# Patient Record
Sex: Female | Born: 1960 | Race: White | Hispanic: No | Marital: Married | Smoking: Current every day smoker
Health system: Southern US, Community
[De-identification: ages and names within clinical notes are randomized; demographics above are authoritative.]

## PROBLEM LIST (undated history)

## (undated) DIAGNOSIS — N3010 Interstitial cystitis (chronic) without hematuria: Secondary | ICD-10-CM

## (undated) DIAGNOSIS — R0602 Shortness of breath: Secondary | ICD-10-CM

## (undated) DIAGNOSIS — F419 Anxiety disorder, unspecified: Secondary | ICD-10-CM

## (undated) DIAGNOSIS — I639 Cerebral infarction, unspecified: Secondary | ICD-10-CM

## (undated) DIAGNOSIS — F329 Major depressive disorder, single episode, unspecified: Secondary | ICD-10-CM

## (undated) DIAGNOSIS — F32A Depression, unspecified: Secondary | ICD-10-CM

## (undated) DIAGNOSIS — F17200 Nicotine dependence, unspecified, uncomplicated: Secondary | ICD-10-CM

## (undated) HISTORY — DX: Nicotine dependence, unspecified, uncomplicated: F17.200

## (undated) HISTORY — DX: Interstitial cystitis (chronic) without hematuria: N30.10

---

## 1997-12-24 ENCOUNTER — Encounter: Payer: Self-pay | Admitting: Emergency Medicine

## 1997-12-24 ENCOUNTER — Emergency Department (HOSPITAL_COMMUNITY): Admission: EM | Admit: 1997-12-24 | Discharge: 1997-12-24 | Payer: Self-pay | Admitting: Emergency Medicine

## 1999-03-17 ENCOUNTER — Inpatient Hospital Stay (HOSPITAL_COMMUNITY): Admission: AD | Admit: 1999-03-17 | Discharge: 1999-03-17 | Payer: Self-pay | Admitting: Obstetrics

## 1999-08-25 ENCOUNTER — Inpatient Hospital Stay (HOSPITAL_COMMUNITY): Admission: AD | Admit: 1999-08-25 | Discharge: 1999-08-25 | Payer: Self-pay | Admitting: *Deleted

## 1999-08-25 ENCOUNTER — Encounter: Payer: Self-pay | Admitting: *Deleted

## 1999-10-28 ENCOUNTER — Emergency Department (HOSPITAL_COMMUNITY): Admission: EM | Admit: 1999-10-28 | Discharge: 1999-10-28 | Payer: Self-pay | Admitting: *Deleted

## 1999-11-25 ENCOUNTER — Emergency Department (HOSPITAL_COMMUNITY): Admission: EM | Admit: 1999-11-25 | Discharge: 1999-11-26 | Payer: Self-pay | Admitting: *Deleted

## 2000-04-21 ENCOUNTER — Emergency Department (HOSPITAL_COMMUNITY): Admission: EM | Admit: 2000-04-21 | Discharge: 2000-04-21 | Payer: Self-pay | Admitting: Emergency Medicine

## 2000-05-01 ENCOUNTER — Emergency Department (HOSPITAL_COMMUNITY): Admission: EM | Admit: 2000-05-01 | Discharge: 2000-05-01 | Payer: Self-pay | Admitting: Internal Medicine

## 2000-05-23 ENCOUNTER — Emergency Department (HOSPITAL_COMMUNITY): Admission: EM | Admit: 2000-05-23 | Discharge: 2000-05-23 | Payer: Self-pay | Admitting: Emergency Medicine

## 2000-06-26 ENCOUNTER — Encounter: Payer: Self-pay | Admitting: Emergency Medicine

## 2000-06-26 ENCOUNTER — Emergency Department (HOSPITAL_COMMUNITY): Admission: EM | Admit: 2000-06-26 | Discharge: 2000-06-26 | Payer: Self-pay | Admitting: Emergency Medicine

## 2000-06-30 ENCOUNTER — Emergency Department (HOSPITAL_COMMUNITY): Admission: EM | Admit: 2000-06-30 | Discharge: 2000-06-30 | Payer: Self-pay | Admitting: Emergency Medicine

## 2000-11-26 ENCOUNTER — Encounter: Payer: Self-pay | Admitting: Emergency Medicine

## 2000-11-26 ENCOUNTER — Emergency Department (HOSPITAL_COMMUNITY): Admission: EM | Admit: 2000-11-26 | Discharge: 2000-11-26 | Payer: Self-pay

## 2000-12-31 ENCOUNTER — Emergency Department (HOSPITAL_COMMUNITY): Admission: EM | Admit: 2000-12-31 | Discharge: 2000-12-31 | Payer: Self-pay

## 2001-10-17 ENCOUNTER — Emergency Department (HOSPITAL_COMMUNITY): Admission: EM | Admit: 2001-10-17 | Discharge: 2001-10-17 | Payer: Self-pay | Admitting: Emergency Medicine

## 2001-10-17 ENCOUNTER — Encounter: Payer: Self-pay | Admitting: Emergency Medicine

## 2001-10-18 ENCOUNTER — Emergency Department (HOSPITAL_COMMUNITY): Admission: EM | Admit: 2001-10-18 | Discharge: 2001-10-18 | Payer: Self-pay | Admitting: Emergency Medicine

## 2001-10-21 ENCOUNTER — Emergency Department (HOSPITAL_COMMUNITY): Admission: EM | Admit: 2001-10-21 | Discharge: 2001-10-21 | Payer: Self-pay | Admitting: Emergency Medicine

## 2002-12-06 ENCOUNTER — Emergency Department (HOSPITAL_COMMUNITY): Admission: EM | Admit: 2002-12-06 | Discharge: 2002-12-06 | Payer: Self-pay | Admitting: Emergency Medicine

## 2003-08-24 ENCOUNTER — Emergency Department (HOSPITAL_COMMUNITY): Admission: EM | Admit: 2003-08-24 | Discharge: 2003-08-24 | Payer: Self-pay | Admitting: Emergency Medicine

## 2003-09-09 ENCOUNTER — Encounter: Admission: RE | Admit: 2003-09-09 | Discharge: 2003-09-09 | Payer: Self-pay | Admitting: Obstetrics and Gynecology

## 2003-09-15 ENCOUNTER — Ambulatory Visit (HOSPITAL_COMMUNITY): Admission: RE | Admit: 2003-09-15 | Discharge: 2003-09-15 | Payer: Self-pay | Admitting: Obstetrics and Gynecology

## 2005-01-03 ENCOUNTER — Encounter: Admission: RE | Admit: 2005-01-03 | Discharge: 2005-01-03 | Payer: Self-pay | Admitting: Neurology

## 2005-01-04 ENCOUNTER — Encounter: Admission: RE | Admit: 2005-01-04 | Discharge: 2005-01-04 | Payer: Self-pay | Admitting: Neurology

## 2005-08-22 ENCOUNTER — Encounter: Admission: RE | Admit: 2005-08-22 | Discharge: 2005-08-22 | Payer: Self-pay | Admitting: Family Medicine

## 2006-03-07 HISTORY — PX: CHOLECYSTECTOMY: SHX55

## 2006-03-07 HISTORY — PX: OTHER SURGICAL HISTORY: SHX169

## 2006-11-21 ENCOUNTER — Ambulatory Visit (HOSPITAL_BASED_OUTPATIENT_CLINIC_OR_DEPARTMENT_OTHER): Admission: RE | Admit: 2006-11-21 | Discharge: 2006-11-21 | Payer: Self-pay | Admitting: Urology

## 2007-08-28 ENCOUNTER — Other Ambulatory Visit: Admission: RE | Admit: 2007-08-28 | Discharge: 2007-08-28 | Payer: Self-pay | Admitting: Gynecology

## 2007-10-24 ENCOUNTER — Emergency Department (HOSPITAL_COMMUNITY): Admission: EM | Admit: 2007-10-24 | Discharge: 2007-10-24 | Payer: Self-pay | Admitting: Emergency Medicine

## 2007-11-06 ENCOUNTER — Ambulatory Visit: Payer: Self-pay | Admitting: Gynecology

## 2007-11-13 ENCOUNTER — Ambulatory Visit (HOSPITAL_COMMUNITY): Admission: RE | Admit: 2007-11-13 | Discharge: 2007-11-13 | Payer: Self-pay | Admitting: Gastroenterology

## 2007-12-03 ENCOUNTER — Encounter (INDEPENDENT_AMBULATORY_CARE_PROVIDER_SITE_OTHER): Payer: Self-pay | Admitting: General Surgery

## 2007-12-03 ENCOUNTER — Ambulatory Visit (HOSPITAL_COMMUNITY): Admission: RE | Admit: 2007-12-03 | Discharge: 2007-12-04 | Payer: Self-pay | Admitting: General Surgery

## 2008-02-05 HISTORY — PX: OTHER SURGICAL HISTORY: SHX169

## 2008-02-27 ENCOUNTER — Ambulatory Visit: Payer: Self-pay | Admitting: Gynecology

## 2008-03-03 ENCOUNTER — Emergency Department (HOSPITAL_COMMUNITY): Admission: EM | Admit: 2008-03-03 | Discharge: 2008-03-03 | Payer: Self-pay | Admitting: Emergency Medicine

## 2008-03-05 ENCOUNTER — Encounter: Payer: Self-pay | Admitting: Gynecology

## 2008-03-05 ENCOUNTER — Ambulatory Visit: Payer: Self-pay | Admitting: Gynecology

## 2008-03-05 ENCOUNTER — Ambulatory Visit (HOSPITAL_BASED_OUTPATIENT_CLINIC_OR_DEPARTMENT_OTHER): Admission: RE | Admit: 2008-03-05 | Discharge: 2008-03-05 | Payer: Self-pay | Admitting: Gynecology

## 2008-03-06 ENCOUNTER — Ambulatory Visit (HOSPITAL_COMMUNITY): Admission: RE | Admit: 2008-03-06 | Discharge: 2008-03-06 | Payer: Self-pay | Admitting: Gynecology

## 2008-03-19 ENCOUNTER — Ambulatory Visit: Payer: Self-pay | Admitting: Gynecology

## 2008-04-01 ENCOUNTER — Ambulatory Visit: Payer: Self-pay | Admitting: Gynecology

## 2008-07-17 ENCOUNTER — Ambulatory Visit: Payer: Self-pay | Admitting: Gynecology

## 2008-09-01 ENCOUNTER — Emergency Department (HOSPITAL_COMMUNITY): Admission: EM | Admit: 2008-09-01 | Discharge: 2008-09-01 | Payer: Self-pay | Admitting: Family Medicine

## 2008-12-06 ENCOUNTER — Emergency Department (HOSPITAL_COMMUNITY): Admission: EM | Admit: 2008-12-06 | Discharge: 2008-12-06 | Payer: Self-pay | Admitting: Emergency Medicine

## 2008-12-11 ENCOUNTER — Encounter (INDEPENDENT_AMBULATORY_CARE_PROVIDER_SITE_OTHER): Payer: Self-pay | Admitting: Family Medicine

## 2008-12-11 ENCOUNTER — Ambulatory Visit: Payer: Self-pay | Admitting: Surgery

## 2008-12-11 ENCOUNTER — Ambulatory Visit: Admission: RE | Admit: 2008-12-11 | Discharge: 2008-12-11 | Payer: Self-pay | Admitting: Family Medicine

## 2008-12-31 IMAGING — CR DG CHEST 2V
2 series · 2 of 2 positions shown · non-contrast
Comparison: 12/31/2000 study report

CLINICAL DATA: History given of cholelithiasis.  Preoperative
cardiopulmonary evaluation.

CHEST - 2 VIEW

[view not recorded (1 of 2)]
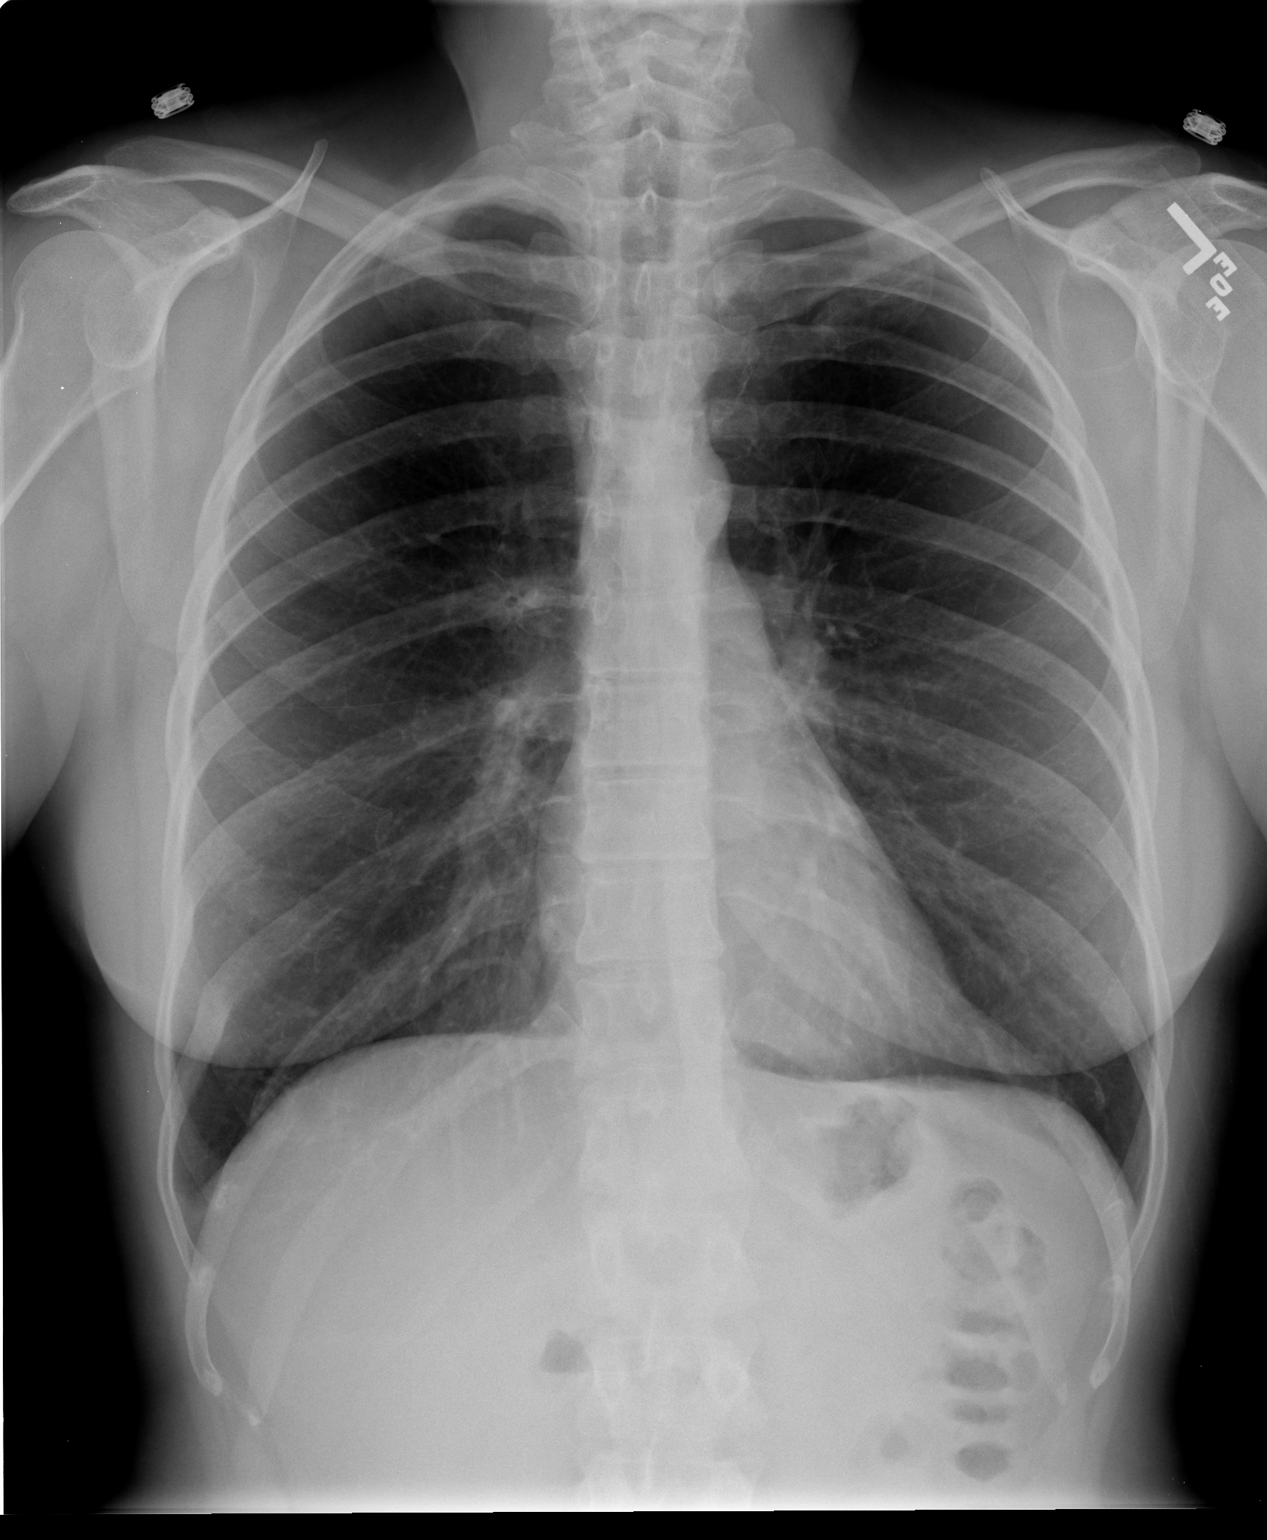

[view not recorded (2 of 2)]
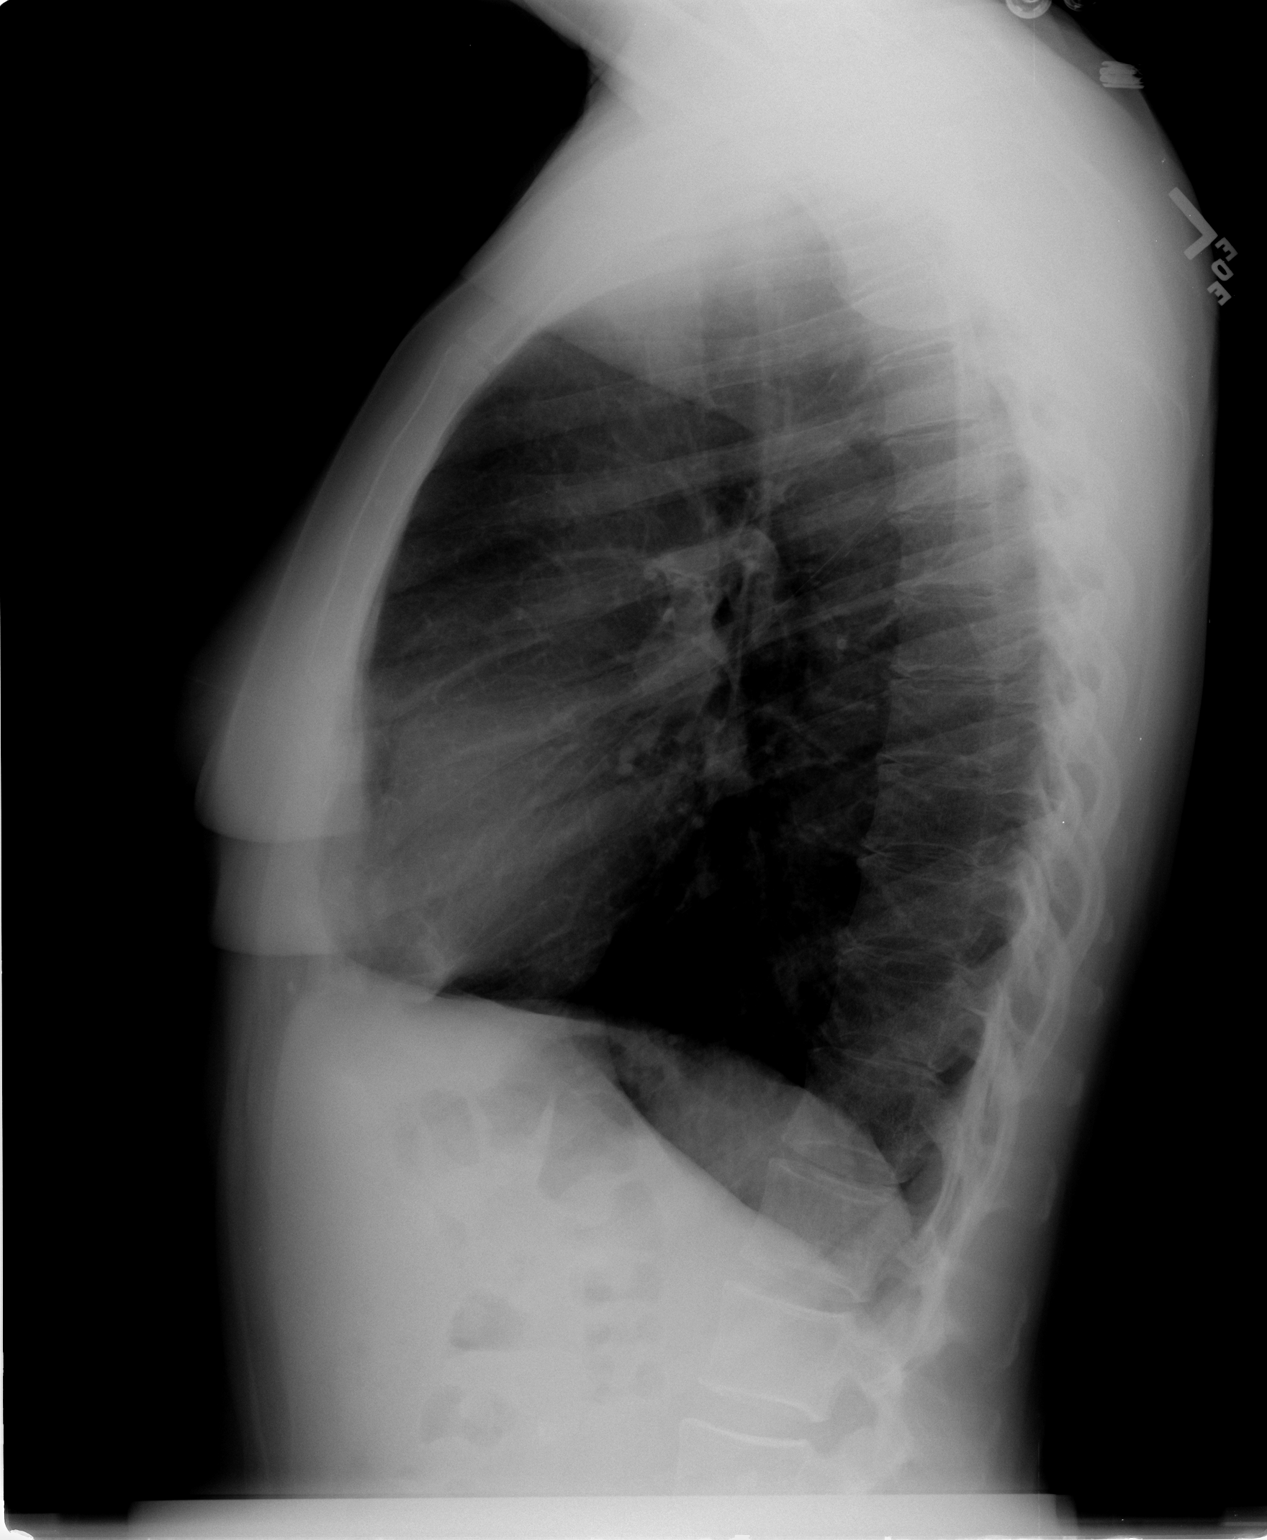

[2 of 2 positions shown; findings below may reference images not displayed]

FINDINGS: Cardiac density is normal shape.  It is small. There is
generalized hyperinflation configuration.  There is flattening and
slight inversion of diaphragm on lateral image.  Lungs are free of
infiltrates.  No pleural disease is seen.  There is minimal
degenerative spondylosis.  Old healed fractures of lower right ribs
are seen.
IMPRESSION: Moderate hyperinflation configuration is seen.  No pulmonary edema,
pneumonia, or pleural effusion is seen.

## 2010-03-27 ENCOUNTER — Encounter: Payer: Self-pay | Admitting: Neurology

## 2010-06-10 ENCOUNTER — Encounter: Payer: Self-pay | Admitting: Gynecology

## 2010-07-20 NOTE — Op Note (Signed)
Lindsay, Joyce               ACCOUNT NO.:  [id number]   MEDICAL RECORD NO.:  [id number]          PATIENT TYPE:  OIB   LOCATION:  5120                         FACILITY:  [facility]   PHYSICIAN:  Juanetta Gosling, MDDATE OF BIRTH:  1960-08-22   DATE OF PROCEDURE:  12/03/2007  DATE OF DISCHARGE:                               OPERATIVE REPORT   PREOPERATIVE DIAGNOSIS:  Biliary dyskinesia.   POSTOPERATIVE DIAGNOSIS:  Biliary dyskinesia.   PROCEDURE:  Laparoscopic cholecystectomy.   SURGEON:  Troy Sine. Dwain Sarna, MD   ASSISTANT:  None.   ANESTHESIA:  General.   SPECIMENS:  Gallbladder and contents to pathology.   ESTIMATED BLOOD LOSS:  Minimal.   COMPLICATIONS:  None.   DRAINS:  None.   DISPOSITION:  To PACU in stable condition.   INDICATIONS:  Lindsay Joyce is a 50 year old female who had been  experiencing epigastric pain for about a month prior to my visit in the  middle of September.  She had been to the emergency room and was given a  course of Prilosec without any relief.  She had an ultrasound on her  initial ER visit, which showed no evidence of any gallstones.  Laboratory evaluation showed normal LFTs and a lipase as well.  She had  been on meloxicam for a couple years and stopped that thinking this  might be the cause of some of her pain.  She states that is present  constantly and does get worse at times but has no relation to eating.  She does have some nausea and occasionally some emesis as well.  She had  seen Dr. Evette Cristal for evaluation and sent for HIDA scan, which showed an  ejection fraction of 26% suggesting biliary dyskinesia.  She had no pain  during the procedure.  I had a long talk with her in discussion about  biliary dyskinesia as a possible source of her pain and we discussed a  laparoscopic cholecystectomy with the understanding that this may  relieve none, some, or all of her current pain.   PROCEDURE IN DETAIL:  After informed consent was  obtained, the patient  was taken to the operating room.  She was administered 1 gram of  cefoxitin prior to beginning the procedure.  She was then placed under  general endotracheal anesthesia without complication.  Sequential  compression devices were placed on her lower extremities throughout the  procedure.  Her abdomen was then prepped and draped in a standard  sterile surgical fashion.  A surgical time-out was then performed.  Following this, a 10-mm vertical incision was then made infraumbilically  and dissection was carried out down to the level of her fascia which was  then incised sharply.  The peritoneum was then entered bluntly with a  Kelly clamp.  A Vicryl purse-string suture was placed in the fascia and  a Hasson trocar was then inserted.  The abdomen was then insufflated to  15 mmHg pressure without complication.  Three further 5-mm ports were  placed in the epigastrium and right upper quadrant after infiltration  with local anesthetic under direct vision without complication.  Her  gallbladder was then retracted cephalad and lateral.  There was a small  amount of adhesions to her duodenum that were removed bluntly.  The  triangle of Calot was then dissected.  There was some scarring in this  region and I dissected her cystic duct, her cystic artery, and obtained  the critical view of safety.  Following this, I clipped and divided the  cystic artery and then I clipped and divided the cystic duct leaving 2  clips in place in both positions.  Following this, I then used the  electrocautery to remove the gallbladder from the gallbladder bed of the  liver without difficulty.  Before I removed the gallbladder, I looked  back down.  There was a small area of bleeding in the liver which was  controlled with electrocautery, but otherwise this was clean with no  evidence of any spillage and hemostasis was observed.  The gallbladder  was then removed.  A 5-mm camera was then put in  the epigastrium.  The  EndoCatch bag was put in the umbilical port.  Gallbladder was then  placed in the bag and then removed from the umbilical port.  A 5-mm  camera was then used to inspect the entrance site.  There was no  evidence of any injury from the entrance.  The purse-string suture was  then tied down with good closure.  I then desufflated the abdomen and  removed all ports.  All the incisions were then closed with 4-0 Monocryl  in a subcuticular fashion.  Dermabond were then placed over the wound.  She tolerated this well, was extubated in the operating room, and  transferred to recovery room in stable condition.      Juanetta Gosling, MD  Electronically Signed     MCW/MEDQ  D:  12/03/2007  T:  12/03/2007  Job:  [id number]   cc:   Graylin Shiver, M.D.  Gloriajean Dell. Andrey Campanile, M.D.

## 2010-07-20 NOTE — Op Note (Signed)
Lindsay, Joyce               ACCOUNT NO.:  [id number]   MEDICAL RECORD NO.:  [id number]          PATIENT TYPE:  AMB   LOCATION:  [facility]                         [facility]ACILITY:  [facility]   PHYSICIAN:  Timothy P. Fontaine, M.D.DATE OF BIRTH:  Jul 25, 1960   DATE OF PROCEDURE:  03/05/2008  DATE OF DISCHARGE:  03/03/2008                               OPERATIVE REPORT   PREOPERATIVE DIAGNOSES:  Menorrhagia, dysmenorrhea, dyspareunia,  irregular menses.   POSTOPERATIVE DIAGNOSES:  Menorrhagia, dysmenorrhea, dyspareunia,  irregular menses.   PROCEDURE:  Laparoscopic-assisted vaginal hysterectomy, bilateral  salpingo-oophorectomy.   SURGEON:  Timothy P. Fontaine, M.D.   ASSISTANTGaetano Hawthorne. Lily Peer, M.D.   ANESTHETIC:  General.   SPECIMEN:  Uterus, right and left fallopian tubes.  Right and left  ovaries.   COMPLICATIONS:  None.   ESTIMATED BLOOD LOSS:  150 mL.   FINDINGS:  EUA:  External BUS and vagina normal.  Cervix normal.  Bimanual:  Uterus normal size, midline and mobile.  Adnexa without  masses.  Laparoscopic:  Anterior cul-de-sac normal.  Posterior cul-de-  sac normal.  Right and left fallopian tubes normal.  Right and left  ovaries normal.  Uterus normal size, shape and contour.  No evidence of  endometriosis or pelvic adhesive disease.  Upper abdominal exam is  grossly normal noting appendix normal free and mobile.  Liver smooth, no  abnormalities.  Gallbladder not visualized.   PROCEDURE:  The patient was taken to the operating room, underwent  general anesthesia was placed in the low dorsal lithotomy position,  received an abdominal perineal vaginal preparation with Betadine  solution.  EUA performed.  Bladder emptied with an indwelling Foley  catheter and a Hulka tenaculum was placed on the cervix.  The patient  was draped in the usual fashion.  A repeat midline infraumbilical  incision was made using the 10 mm OptiView direct entry trocar.  The  abdomen was directly  entered under direct visualization without  difficulty and subsequently insufflated.  Right and left 5 mm suprapubic  ports were then placed under direct visualization after  transillumination of the vessels without difficulty.  Examination of the  pelvic organs, upper abdominal exam was carried out with findings noted  above.  The left infundibulopelvic ligament and vessels were identified.  The ureter identified away from the site and using the Harmonic scalpel,  the infundibulopelvic ligament and vessels was transected using the  Harmonic scalpel.  The adnexa was progressively freed through incision  with the Harmonic scalpel, the broad ligament and peritoneal reflections  to the level of the round ligament which was transected again with a  Harmonic scalpel.  The anterior vesicouterine peritoneal bladder flap  was then sharply incised with the Harmonic scalpel to the midline.  A  similar procedure was carried out on the other side.  At this point, the  vaginal portion of the procedure was undertaken.  The patient was placed  in a high dorsal lithotomy position.  The cervix visualized with a  weighted speculum, the Hulka tenaculum removed and a single tenaculum  placed on the cervix.  The  cervical mucosa was circumferentially  injected using 1% lidocaine with epinephrine mixture and subsequently  sharply incised.  The paracervical planes were sharply developed and  ultimately the anterior cul-de-sac was sharply entered without  difficulty.  The posterior cul-de-sac was likewise entered and a long  weighted speculum was placed.  The right and left uterosacral ligaments  were then identified, clamped, cut and ligated using zero Vicryl suture  and tagged for future reference.  The uterus was then progressively  freed from its attachments through clamping, cutting and ligating of the  paracervical, parametrial tissues using zero Vicryl suture to the level  of the uterine vessels.  At this  point, the uterus was delivered from  the vagina and the uterus, fallopian tubes and ovaries were sent to  pathology.  A long weighted speculum was replaced with a shorter  weighted speculum.  The posterior vaginal cuff grasped with an Allis  clamp and the posterior cul-de-sac irrigated.  Hemostasis visualized.  A  tail sponge tag was placed to hold the intestines from the site.  The  posterior vaginal mucosa was then run from uterosacral ligament to  uterosacral ligament using zero Vicryl suture in a running interlocking  stitch and subsequently the tail sponge was removed and the vagina was  closed anterior to posterior using zero Vicryl suture in interrupted  figure-of-eight stitch.  The vagina was then irrigated.  Hemostasis  visualized.  The patient was placed in low dorsal lithotomy position and  after re-gloving the abdomen was reinsufflated.  The pelvis irrigated  and hemostasis ultimately achieved using bipolar cautery at several  small bleeding sites.  The gas was then slowly allowed to escape.  All  surgical sites inspected under low pressure situation showing adequate  hemostasis.  The 5 mm suprapubic pubic ports were removed and again  hemostasis visualized.  The infraumbilical port was then removed under  direct visualization showing adequate hemostasis.  No evidence of hernia  formation.  All skin incisions were injected using 0.25% Marcaine and  the infraumbilical port was closed using zero Vicryl suture in a  interrupted subcutaneous fascial stitch.  The skin was then closed using  4-0 Vicryl in interrupted cutaneous stitch.  The suprapubic 5 mm ports  were closed using Dermabond skin adhesive.  The patient was placed in  the supine position, awakened without difficulty and was taken to the  recovery room in good condition having tolerated the procedure well with  free-flowing clear yellow urine.      Timothy P. Fontaine, M.D.  Electronically Signed     TPF/MEDQ   D:  03/05/2008  T:  03/06/2008  Job:  [id number]

## 2010-07-20 NOTE — Op Note (Signed)
[name], Lindsay Joyce               ACCOUNT NO.:  [id number]   MEDICAL RECORD NO.:  [id number]          PATIENT TYPE:  AMB   LOCATION:  [facility]                         FACILITY:  [facility]   PHYSICIAN:  Excell Seltzer. Annabell Howells, M.D.    DATE OF BIRTH:  March 11, 1960   DATE OF PROCEDURE:  11/21/2006  DATE OF DISCHARGE:                               OPERATIVE REPORT   PROCEDURE:  Cystoscopy, hydrodistention of the bladder, urethral  dilation, installation of Pyridium and Marcaine.   PREOPERATIVE DIAGNOSIS:  Painful bladder, rule out interstitial cystitis  with urethral stenosis.   POSTOPERATIVE DIAGNOSIS:  Chronic follicular cystitis the urethral  stenosis.   SURGEON:  Excell Seltzer. Annabell Howells, M.D.   ANESTHESIA:  General.   SPECIMEN:  None.   DRAINS:  None.   COMPLICATIONS:  None.   INDICATIONS:  Ms. Lindsay Joyce is a 50 year old white female with history of  lower abdominal discomfort for many years that is worse around her  period.  She also has diminished stream with some dribbling as well as  urgency and urge incontinence.  Urodynamics revealed a small capacity  hypersensitive bladder with possible DSD.  She has had a prior urethral  dilation remotely which helped her symptoms.   FINDINGS AND PROCEDURE:  The patient was taken to the operating room  after receiving Cipro.  A general anesthetic was induced.  She was  placed in lithotomy position.  Her perineum and genitalia were prepped  with Betadine solution.  She was draped in the usual sterile fashion.   Cystoscopy was performed in the usual fashion with a 22-French scope  with the 12 and 70-degree lenses.  Examination revealed some blanching  of the proximal urethral mucosa with some narrowing in this area.  There  was squamous metaplasia in the trigone.  The bladder wall was mildly  trabeculated with diffuse findings consistent with chronic follicular  cystitis.  The ureteral orifices were unremarkable.   After thorough cystoscopic inspection, the  bladder was dilated under 80  cm water pressure to capacity.  This was held for about 3 minutes, and  the bladder was drained.  Her bladder capacity under anesthesia was only  400 mL.  Repeat cystoscopy after hydrodistention revealed glomerulations  primarily in the trigone and lateral walls of the bladder.  A second  fill with a slightly higher pressure was performed as well.  After  completion of hydrodistention, the urethra was calibrated from 26 to 40-  [place] with female sounds.  She was tight at 26 but dilated reasonably  easily.   After completion of the urethral dilation, her bladder was instilled  with 30 mL of 0.25% Marcaine and 400 mg of crushed Pyridium.  She was  given a B&O suppository.  She was taken down from lithotomy position.  Her anesthetic was reversed.  She was moved to the recovery room in  stable condition.  There were no complications.      Excell Seltzer. Annabell Howells, M.D.  Electronically Signed     JJW/MEDQ  D:  11/21/2006  T:  11/21/2006  Job:  [id number]   cc:  Gloriajean Dell. Andrey Campanile, M.D.  Fax: [phone]

## 2010-07-20 NOTE — H&P (Signed)
Lindsay Joyce, Lindsay Joyce               ACCOUNT NO.:  [id number]   MEDICAL RECORD NO.:  [id number]          PATIENT TYPE:  AMB   LOCATION:  [facility]                         FACILITY:  [facility]   PHYSICIAN:  Timothy P. Fontaine, M.D.DATE OF BIRTH:  17-Dec-1960   DATE OF ADMISSION:  03/05/2008  DATE OF DISCHARGE:                              HISTORY & PHYSICAL   Being admitted to [facility] 30 December for surgery.   CHIEF COMPLAINT:  Menorrhagia, dysmenorrhea, irregular menses,  persistent deep dyspareunia.   HISTORY OF PRESENT ILLNESS:  A 50 year old G5, P2 female using condom  birth control with worsening periods lasting 9-14 days, increasing  cramping with her periods, irregularity in her cycles as well as deep  dyspareunia with every coital episode admitted for LAVH/BSO.  The  patient's evaluation included a benign endometrial biopsy, a  sonohystogram which was normal without intracavitary abnormalities.  She  did have some cystic changes bilaterally on her ovaries which appeared  to be physiologic.  Options for management to include conservative  hormonal manipulation, Mirena IUD, laparoscopy for her pelvic pain with  sterilization for birth control, endometrial ablation up to and  including hysterectomy was all reviewed.  She elects for hysterectomy  with LAVH to assess pelvis to rule out endometriosis, and a BSO.   PAST MEDICAL HISTORY:  Anxiety disorder.   PAST SURGICAL HISTORY:  Includes the urethral dilatation and  cholecystectomy, laparoscopic.   CURRENT MEDICATIONS:  1. Xanax 1 mg q.i.d.  2. Prozac.   ALLERGIES:  No medications.   REVIEW OF SYSTEMS:  Noncontributory.   FAMILY HISTORY:  Significant for reported ovarian cancer in both her  mother and grandmother.   SOCIAL HISTORY:  Significant for cigarette use.   PHYSICAL EXAM:  VITAL SIGNS:  Afebrile.  Vital signs stable.  HEENT: Normal.  LUNGS:  Clear.  CARDIAC:  Regular rate and rhythm.  No rubs,  murmurs or gallops.  ABDOMINAL:  Exam benign.  PELVIC:  External BUS, vagina normal.  Cervix normal.  Uterus normal  size, midline mobile, nontender.  Adnexa without masses or tenderness.   ASSESSMENT:  A 50 year old G5, P3, AB 3 female worsening dysmenorrhea,  menorrhagia, dyspareunia, family history of ovarian cancer for LAVH/BSO.  I reviewed the proposed surgery with the patient.  Expected  intraoperative and postoperative courses, short-term long-term issues  with hysterectomy, BSO.  I reviewed the absolute irreversible sterility  associated with hysterectomy, which she understands and accepts.  Sexuality following hysterectomy and the potential for persistent  orgasmic dysfunction as well as persistent dyspareunia was discussed,  understood and accepted.  She understands there are no guarantees as far  as pain relief that the dyspareunia she is having now may persist,  worsen or change following the procedure as well as other pelvic pain  and she understands and accepts this.  The ovarian conservation issue  was reviewed with her.  She does give a history of ovarian cancer in  both her mother and grandmother.  The options of keeping both ovaries  for continued hormone production, but the risk of ovarian disease in the  future both benign and malignant were reviewed as well as removing both  ovaries.  She clearly understands there are no guarantees that she would  not develop a peritoneal carcinoma and the issues of hypoestrogenism  leading to symptoms which are intolerable, leading to ERT was discussed.  The risks of ERT reviewed to include the WHI study, possible increased  risks of breast cancer, increased risks of stroke, heart attack, DVT was  all reviewed with her.  The issues of possible accelerated  cardiovascular risks by removing her ovaries as well as accelerated bone  loss was also discussed.  She does smoke cigarettes, which certainly  contributes to all of these issues.   The patient wants both ovaries  removed.  She accepts the risks of ERT in the future if indeed she  starts this, as well as the risks of hypoestrogenism.  The acute  intraoperative postoperative courses and risks were discussed to include  the risk of infection generalized requiring prolonged antibiotics,  abscess or hematoma formation requiring reoperation abscess, hematoma  drainage, incisional complications requiring opening and draining of  incisions, closure by secondary intention, long-term issues of hernia  formation and scar cosmetics with incisions discussed.  She understands  the plan is LAVH but at any time during the procedure if it is felt  unsafe to proceed or if complications arise will make a larger incision  and proceed with a TAH, and she understands and accepts this.  The risk  of bleeding leading to hemorrhage necessitating transfusion and risks of  transfusion were reviewed to include transfusion reaction, hepatitis,  HIV, mad cow disease and other unknown entities.  The risk of  inadvertent injury to internal organs including bowel, bladder, ureters,  vessels and nerves necessitating major exploratory reparative surgeries,  future reparative surgeries, bladder repair, ureteral damage repair,  bowel resection, ostomy formation was all discussed, understood and  accepted.  The patient's questions were answered to her satisfaction.  She is ready to proceed with surgery.      Timothy P. Fontaine, M.D.  Electronically Signed     TPF/MEDQ  D:  02/27/2008  T:  02/27/2008  Job:  [id number]

## 2010-07-23 NOTE — Group Therapy Note (Signed)
NAME:  Lindsay Joyce, Lindsay Joyce                          ACCOUNT NO.:  [id number]   MEDICAL RECORD NO.:  [id number]                   PATIENT TYPE:  OUT   LOCATION:  [facility]                           FACILITY:  [facility]   PHYSICIAN:  Argentina Donovan, MD                     DATE OF BIRTH:  [birth year]   DATE OF SERVICE:  09/09/2003                                    CLINIC NOTE   REASON FOR VISIT:  The patient is a 51 year old gravida 5 para 2-0-3-2 with  one VIP and two spontaneous ABs.  She has been complaining for 4 years  approximately of left lower quadrant pain, almost constant, not associated  with diarrhea, constipation, nausea, vomiting, although she has complained  last week of some diarrhea and she had an episode several months ago of  heavy rectal bleeding which was never followed up although she has  hemorrhoids and thought it might be secondary to that.  She has no periods  for the last year because she is on Depo-Provera.   PHYSICAL EXAMINATION:  Her abdomen is soft, flat, slightly tender in the  left lower quadrant on deep palpation but no guarding or rebound and no  masses palpable.  External genitalia is normal.  The vagina is clean and  well rugated.  The cervix is parous and clean.  The uterus is retroverted  and small, normal, size, and consistency.  The adnexa could not be well  palpated and she did not have significant pain although there was slight  grimace when we pushed hard into the left lower quadrant.   The plan is to get an ultrasound to see whether or not she has any obvious  pathology there.  If nothing shows up I would refer her to a  gastroenterologist because of the persistent pain and also the episode of  heavy rectal bleeding on one episode.  She had a wet prep and GC and  chlamydia culture at the clinic [place] not too long ago and was treated  with metronidazole at that time.  She also has a history of HSV 2 with  irregular outbreaks.   IMPRESSION:  Chronic  left lower quadrant pain of unknown etiology.                                               Argentina Donovan, MD    PR/MEDQ  D:  09/09/2003  T:  09/09/2003  Job:  [id number]

## 2010-12-06 LAB — CBC
HCT: 40.8
Hemoglobin: 13.8
MCHC: 33.8
MCV: 91.7
Platelets: 350
RBC: 4.45
RDW: 14.2
WBC: 9.7

## 2010-12-06 LAB — URINALYSIS, ROUTINE W REFLEX MICROSCOPIC
Bilirubin Urine: NEGATIVE
Glucose, UA: NEGATIVE
Hgb urine dipstick: NEGATIVE
Ketones, ur: NEGATIVE
Nitrite: NEGATIVE
Protein, ur: NEGATIVE
Specific Gravity, Urine: 1.018
Urobilinogen, UA: 1
pH: 7.5

## 2010-12-06 LAB — BASIC METABOLIC PANEL
BUN: 7
CO2: 27
Calcium: 8.9
Chloride: 103
Creatinine, Ser: 0.8
GFR calc Af Amer: >60
GFR calc non Af Amer: >60
Glucose, Bld: 83
Potassium: 4.1
Sodium: 137

## 2010-12-06 LAB — URINE MICROSCOPIC-ADD ON

## 2010-12-06 LAB — PREGNANCY, URINE: Preg Test, Ur: NEGATIVE

## 2010-12-09 LAB — DIFFERENTIAL
Basophils Absolute: 0.1 10*3/uL (ref 0.0–0.1)
Basophils Relative: 1 % (ref 0–1)
Eosinophils Absolute: 0.3 10*3/uL (ref 0.0–0.7)
Eosinophils Relative: 3 % (ref 0–5)
Lymphocytes Relative: 34 % (ref 12–46)
Lymphs Abs: 2.8 10*3/uL (ref 0.7–4.0)
Monocytes Absolute: 0.4 10*3/uL (ref 0.1–1.0)
Monocytes Relative: 5 % (ref 3–12)
Neutro Abs: 4.9 10*3/uL (ref 1.7–7.7)
Neutrophils Relative %: 58 % (ref 43–77)

## 2010-12-09 LAB — CBC
HCT: 34.9 % — ABNORMAL LOW (ref 36.0–46.0)
Hemoglobin: 11.8 g/dL — ABNORMAL LOW (ref 12.0–15.0)
MCHC: 33.7 g/dL (ref 30.0–36.0)
MCV: 91.6 fL (ref 78.0–100.0)
Platelets: 261 10*3/uL (ref 150–400)
RBC: 3.81 MIL/uL — ABNORMAL LOW (ref 3.87–5.11)
RDW: 14 % (ref 11.5–15.5)
WBC: 8.5 10*3/uL (ref 4.0–10.5)

## 2010-12-16 LAB — POCT PREGNANCY, URINE
Operator id: 268271
Preg Test, Ur: NEGATIVE

## 2010-12-16 LAB — POCT HEMOGLOBIN-HEMACUE
Hemoglobin: 15.3 — ABNORMAL HIGH
Operator id: 268271

## 2010-12-30 DIAGNOSIS — F17200 Nicotine dependence, unspecified, uncomplicated: Secondary | ICD-10-CM | POA: Insufficient documentation

## 2011-01-04 ENCOUNTER — Other Ambulatory Visit (HOSPITAL_COMMUNITY)
Admission: RE | Admit: 2011-01-04 | Discharge: 2011-01-04 | Disposition: A | Discharge status: Home / Self Care | Payer: 59 | Source: Ambulatory Visit | Attending: Gynecology | Admitting: Gynecology

## 2011-01-04 ENCOUNTER — Ambulatory Visit (INDEPENDENT_AMBULATORY_CARE_PROVIDER_SITE_OTHER): Payer: 59 | Admitting: Gynecology

## 2011-01-04 ENCOUNTER — Encounter: Payer: Self-pay | Admitting: *Deleted

## 2011-01-04 ENCOUNTER — Encounter: Payer: Self-pay | Admitting: Gynecology

## 2011-01-04 VITALS — BP 100/60 | Ht 62.0 in | Wt 162.0 lb

## 2011-01-04 DIAGNOSIS — R109 Unspecified abdominal pain: Secondary | ICD-10-CM

## 2011-01-04 DIAGNOSIS — R3911 Hesitancy of micturition: Secondary | ICD-10-CM

## 2011-01-04 DIAGNOSIS — B373 Candidiasis of vulva and vagina: Secondary | ICD-10-CM

## 2011-01-04 DIAGNOSIS — R1030 Lower abdominal pain, unspecified: Secondary | ICD-10-CM

## 2011-01-04 DIAGNOSIS — N390 Urinary tract infection, site not specified: Secondary | ICD-10-CM

## 2011-01-04 DIAGNOSIS — N951 Menopausal and female climacteric states: Secondary | ICD-10-CM

## 2011-01-04 DIAGNOSIS — Z01419 Encounter for gynecological examination (general) (routine) without abnormal findings: Secondary | ICD-10-CM

## 2011-01-04 MED ORDER — CIPROFLOXACIN HCL 250 MG PO TABS: 250.0000 mg | ORAL_TABLET | Freq: Two times a day (BID) | ORAL | Status: AC

## 2011-01-04 MED ORDER — FLUCONAZOLE 150 MG PO TABS: 150.0000 mg | ORAL_TABLET | Freq: Once | ORAL | Status: AC

## 2011-01-04 NOTE — Progress Notes (Unsigned)
  Pt called pharmacy never got rx for diflucan 150 mg and cipro 250 mg both called in to pharmacy.

## 2011-01-04 NOTE — Progress Notes (Signed)
Lindsay Joyce Apr 02, 1960 [id number]        50 y.o.  for annual exam.  Has not been in in several years. Status post LAVH BSO in 09. Had transiently been on ERT but stopped has not been on it for several years. She has several complaints which are all noted below.  Past medical history,surgical history, medications, allergies, family history and social history were all reviewed and documented in the EPIC chart. ROS:  Was performed and pertinent positives and negatives are included in the history.  Exam: chaperone present There were no vitals filed for this visit. General appearance  Normal Skin grossly normal Head/Neck normal with no cervical or supraclavicular adenopathy thyroid normal Lungs  clear Cardiac RR, without RMG Abdominal  soft, nontender, without masses, organomegaly or hernia Breasts  examined lying and sitting without masses, retractions, discharge or axillary adenopathy. Pelvic  Ext/BUS/vagina  normal with white discharge Pap of cuff done  Adnexa  Without masses or tenderness    Anus and perineum  normal   Rectovaginal  normal sphincter tone without palpated masses or tenderness.    Assessment/Plan:  50 y.o. female for annual exam.    1. White discharge. Patient notes several months history of intermittent discharge. KOH wet prep is positive for yeast we'll treat with Diflucan 150x1 dose follow up if symptoms persist or recur. 2. Urinary hesitancy. Patient notes it's difficult passing her stream. Her UA is consistent with UTI we'll go ahead and treat with ciprofloxacin twice a day x7 days. She does have a history of interstitial cystitis and urethral dilatation in the past and I asked her regardless make appointment to see her urologist Dr. Wilson Singer in follow up. 3. Lower abdominal cramping. Patient also noted some blood in her stool intermittently. She has diarrhea on and off no constipation. I discussed with her I think her lower abdominal discomfort is intestinal.  She has seen Dr. Evette Cristal in the past and I asked her make appointment to see him in follow up and she agrees to do so. She is status post LAVH BSO and I don't think that a gynecologic etiology is causing her discomfort. 4. Menopausal symptoms. Patient is having some hot flashes night sweats fatigue decreased libido memory issues. I reviewed the issue of ERT risks benefits WHI study increased risk of stroke heart attack DVT possible breast cancer linkage. The ACOG and NAMS statements of lowest dose for shortness period of time reviewed. She does smoke and the issue of thrombosis was stressed. The advantage of transdermal from a first pass effect was reviewed. She wants to go ahead and try I gave her 2 sample weeks of Vivelle dot 0.05 mg patches. She'll try these call me at the end of the 2 weeks in follow up and then we'll go from there. 5. Health maintenance. I did a Pap today noting she is status post hysterectomy but we do not have a long history from a Pap smear standpoint I just wanted to survey the cuff before discontinuing Pap smears. SBE monthly reviewed. It's been several years since her mammogram I strongly urged her to schedule that she agrees to do so. Increase calcium vitamin D reviewed. We'll plan bone density at 50. No blood work was done today and she relates that Dr. Andrey Campanile has done all of her blood work recently to include a thyroid screen. Stop smoking reviewed.    [name] MD, 2:53 PM 01/04/2011

## 2011-09-06 ENCOUNTER — Encounter: Payer: Self-pay | Admitting: Gynecology

## 2011-09-06 ENCOUNTER — Ambulatory Visit (INDEPENDENT_AMBULATORY_CARE_PROVIDER_SITE_OTHER): Payer: 59 | Admitting: Gynecology

## 2011-09-06 VITALS — Temp 98.2°F

## 2011-09-06 DIAGNOSIS — R350 Frequency of micturition: Secondary | ICD-10-CM

## 2011-09-06 DIAGNOSIS — IMO0001 Reserved for inherently not codable concepts without codable children: Secondary | ICD-10-CM

## 2011-09-06 DIAGNOSIS — N898 Other specified noninflammatory disorders of vagina: Secondary | ICD-10-CM

## 2011-09-06 DIAGNOSIS — N390 Urinary tract infection, site not specified: Secondary | ICD-10-CM

## 2011-09-06 LAB — URINALYSIS W MICROSCOPIC + REFLEX CULTURE
Bilirubin Urine: NEGATIVE
Casts: NONE SEEN
Crystals: NONE SEEN
Glucose, UA: NEGATIVE mg/dL
Hgb urine dipstick: NEGATIVE
Nitrite: NEGATIVE
Protein, ur: NEGATIVE mg/dL
RBC / HPF: NONE SEEN RBC/hpf (ref <3)
Specific Gravity, Urine: 1.025 (ref 1.005–1.030)
Urobilinogen, UA: 0.2 mg/dL (ref 0.0–1.0)
pH: 6 (ref 5.0–8.0)

## 2011-09-06 LAB — WET PREP FOR TRICH, YEAST, CLUE: Yeast Wet Prep HPF POC: NONE SEEN

## 2011-09-06 MED ORDER — CIPROFLOXACIN HCL 250 MG PO TABS: 250.0000 mg | ORAL_TABLET | Freq: Two times a day (BID) | ORAL | Status: AC

## 2011-09-06 MED ORDER — METRONIDAZOLE 500 MG PO TABS: 500.0000 mg | ORAL_TABLET | Freq: Two times a day (BID) | ORAL | Status: AC

## 2011-09-06 MED ORDER — HYDROCODONE-ACETAMINOPHEN 5-325 MG PO TABS: 1.0000 | ORAL_TABLET | Freq: Four times a day (QID) | ORAL | Status: AC | PRN

## 2011-09-06 NOTE — Patient Instructions (Signed)
Take both antibiotics twice daily for 7 days. Avoid alcohol. If urinary symptoms persist, worsen or recur then we will refer you to urology given your complex urologic history.

## 2011-09-06 NOTE — Progress Notes (Signed)
Patient presents with one to two-week history of increasing dysuria, more frequent smaller voiding and low back discomfort. Does have history of interstitial cystitis, UTIs and a history of urethral dilatation. Also complaining of a slight vaginal discharge. No fever chills nausea vomiting diarrhea constipation.  Exam was Sherrilyn Rist Asst. Afebrile Back: Spine straight with slight left CVA tenderness to percussion. Abdomen: Lower mid abdominal discomfort to palpation. No masses guarding rebound organomegaly. Pelvic: External BUS vagina with white discharge. Bimanual without masses mild suprapubic tenderness.  Urinalysis:  Ref. Range 09/06/2011 15:46  Color, Urine Latest Range: YELLOW  YELLOW  APPearance Latest Range: CLEAR  CLOUDY (A)  Specific Gravity, Urine Latest Range: 1.005-1.030  1.025  pH Latest Range: 5.0-8.0  6.0  Glucose, UA Latest Range: NEG mg/dL NEG  Bilirubin Urine Latest Range: NEG  NEG  Ketones, ur Latest Range: NEG mg/dL TRACE (A)  Protein Latest Range: NEG mg/dL NEG  Urobilinogen, UA Latest Range: 0.0-1.0 mg/dL 0.2  Nitrite Latest Range: NEG  NEG  Leukocytes, UA Latest Range: NEG  SMALL (A)  Hgb urine dipstick Latest Range: NEG  NEG  WBC, UA Latest Range: <3 WBC/hpf 7-10 (A)  RBC / HPF Latest Range: <3 RBC/hpf NONE SEEN  Squamous Epithelial / LPF Latest Range: RARE  FEW  Bacteria, UA Latest Range: RARE  FEW (A)  Crystals Latest Range: NONE SEEN  NONE SEEN  Casts Latest Range: NONE SEEN  NONE SEEN   Wet prep: Consistent with bacterial vaginosis   Assessment and plan: 1. UTI. We'll treat with ciprofloxacin 250 mg twice a day x7 days. If symptoms persist, worsen or recur recommended follow up with urology given her complex history. It completely clears him we'll follow. Patient is having a lot of discomfort and apparently is hyperesthetic per her and husband's history. Will cover with Vicodin 5/325 #20 one to 2 by mouth Q6 hours when necessary pain no refill. 2. Bacterial  vaginosis. We'll treat with Flagyl 500 twice a day x7 days, alcohol avoidance reviewed.

## 2011-09-09 LAB — URINE CULTURE: Colony Count: >=100000

## 2011-09-14 ENCOUNTER — Telehealth: Payer: Self-pay | Admitting: Gynecology

## 2011-09-14 MED ORDER — NITROFURANTOIN MONOHYD MACRO 100 MG PO CAPS: 100.0000 mg | ORAL_CAPSULE | Freq: Two times a day (BID) | ORAL | Status: AC

## 2011-09-14 NOTE — Telephone Encounter (Signed)
Tell patient that her culture ultimately grew out bacteria that is resistant to ciprofloxacin. I want her to begin Macrobid 100 mg twice a day x7 days.

## 2011-09-14 NOTE — Telephone Encounter (Signed)
Left message for pt to call.

## 2011-09-14 NOTE — Telephone Encounter (Signed)
Pt informed with the below note. 

## 2013-01-01 ENCOUNTER — Ambulatory Visit (INDEPENDENT_AMBULATORY_CARE_PROVIDER_SITE_OTHER): Payer: 59 | Admitting: Gynecology

## 2013-01-01 ENCOUNTER — Encounter: Payer: Self-pay | Admitting: Gynecology

## 2013-01-01 VITALS — BP 120/70 | Ht 62.0 in | Wt 145.0 lb

## 2013-01-01 DIAGNOSIS — Z01419 Encounter for gynecological examination (general) (routine) without abnormal findings: Secondary | ICD-10-CM

## 2013-01-01 DIAGNOSIS — R109 Unspecified abdominal pain: Secondary | ICD-10-CM

## 2013-01-01 DIAGNOSIS — N3010 Interstitial cystitis (chronic) without hematuria: Secondary | ICD-10-CM

## 2013-01-01 NOTE — Progress Notes (Signed)
Lindsay Joyce June 27, 1960 [id number]        52 y.o.  [id number] for annual exam.  Several issues noted below.  Past medical history,surgical history, medications, allergies, family history and social history were all reviewed and documented in the EPIC chart.  ROS:  Performed and pertinent positives and negatives are included in the history, assessment and plan .  Exam: [name] assistant Filed Vitals:   01/01/13 1522  BP: 120/70  Height: 5\' 2"  (1.575 m)  Weight: 145 lb (65.772 kg)   General appearance  Normal Skin grossly normal Head/Neck normal with no cervical or supraclavicular adenopathy thyroid normal Lungs  clear Cardiac RR, without RMG Abdominal  soft, nontender, without masses, organomegaly or hernia Breasts  examined lying and sitting without masses, retractions, discharge or axillary adenopathy. Pelvic  Ext/BUS/vagina  normal with pain along anterior vaginal mucosa when palpating the bladder.  Adnexa  Without masses or tenderness    Anus and perineum  normal   Rectovaginal  normal sphincter tone without palpated masses or tenderness.    Assessment/Plan:  52 y.o. [id number] female for annual exam.   1. Lower abdominal pain. Particularly when she is urinating. Exam shows tenderness over the bladder. History of interstitial cystitis. Recommend patient followup with her urologist for further evaluation and treatment. She is status post LAVH BSO in the past without GYN organs as etiology. Do not feel further GYN imaging warranted at this time. Having some diarrhea that she attributes to dietary changes. No overt constipation. Had seen Dr. Evette Cristal in the past for abdominal pain. If pain would persist despite urology evaluation and consider reevaluation by gastroenterology. Check urinalysis today. 2. Status post LAVH BSO in the past. Had tried ERT previously but stopped. Said she does not want to take the risk and prefers to stay off of ERT. Is having some hot flushes and sweats but finds them  acceptable at this point. 3. Mammography 2007. Patient knows she is way overdue. Need to schedule now discussed and acknowledged. SBE monthly reviewed. 4. Pap smear 2012. No Pap smear done today. No history of abnormal Pap smears previously. Options to stop screening altogether she is status post hysterectomy for benign indications versus less frequent screening intervals reviewed. Will readdress on an annual basis. 5. Stop smoking strategies reviewed. Patient acknowledges the need to stop. 6. Health maintenance. No blood work done as patient reports this is done through her primary physician's office. Followup with urology otherwise followup here in one year.  Note: This document was prepared with digital dictation and possible smart phrase technology. Any transcriptional errors that result from this process are unintentional.   [name] MD, 3:57 PM 01/01/2013

## 2013-01-01 NOTE — Patient Instructions (Addendum)
Followup with urology in reference to your pain.  Call to Schedule your mammogram  Facilities in [place]: 1)  [facility] [place], [place] [address] Phone: [phone]  [facility], [phone] Phone: [phone]  Dr. Yolanda Bonine at [facility] [address] Phone: [phone]     Mammogram A mammogram is an X-ray test to find changes in a woman's breast. You should get a mammogram if:  You are 108 years of age or older  You have risk factors.   Your doctor recommends that you have one.  BEFORE THE TEST  Do not schedule the test the week before your period, especially if your breasts are sore during this time.  On the day of your mammogram:  Wash your breasts and armpits well. After washing, do not put on any deodorant or talcum powder on until after your test.   Eat and drink as you usually do.   Take your medicines as usual.   If you are diabetic and take insulin, make sure you:   Eat before coming for your test.   Take your insulin as usual.   If you cannot keep your appointment, call before the appointment to cancel. Schedule another appointment.  TEST  You will need to undress from the waist up. You will put on a hospital gown.   Your breast will be put on the mammogram machine, and it will press firmly on your breast with a piece of plastic called a compression paddle. This will make your breast flatter so that the machine can X-ray all parts of your breast.   Both breasts will be X-rayed. Each breast will be X-rayed from above and from the side. An X-ray might need to be taken again if the picture is not good enough.   The mammogram will last about 15 to 30 minutes.  AFTER THE TEST Finding out the results of your test Ask when your test results will be ready. Make sure you get your test results.  Document Released: 05/20/2008 Document Revised: 02/10/2011  Document Reviewed: 05/20/2008 [facility] Patient Information 2012 [place], [place].

## 2013-01-02 ENCOUNTER — Telehealth: Payer: Self-pay | Admitting: *Deleted

## 2013-01-02 DIAGNOSIS — R300 Dysuria: Secondary | ICD-10-CM

## 2013-01-02 LAB — URINALYSIS W MICROSCOPIC + REFLEX CULTURE
Bilirubin Urine: NEGATIVE
Casts: NONE SEEN
Crystals: NONE SEEN
Glucose, UA: NEGATIVE mg/dL
Hgb urine dipstick: NEGATIVE
Ketones, ur: NEGATIVE mg/dL
Nitrite: NEGATIVE
Protein, ur: NEGATIVE mg/dL
Specific Gravity, Urine: 1.013 (ref 1.005–1.030)
Squamous Epithelial / LPF: NONE SEEN
Urobilinogen, UA: 1 mg/dL (ref 0.0–1.0)
pH: 6 (ref 5.0–8.0)

## 2013-01-02 NOTE — Telephone Encounter (Signed)
Message copied by Aura Camps on Wed Jan 02, 2013  9:31 AM ------      Message from: [name]      Created: Tue Jan 01, 2013  4:01 PM       Help facilitate urology appointment reference interstitial cystitis flare. Had seen Dr. Annabell Howells in the past ------

## 2013-01-02 NOTE — Telephone Encounter (Signed)
Appointment on 01/23/13 @ 11:00 with Dr.Wrenn.

## 2013-01-02 NOTE — Telephone Encounter (Signed)
Pt said she could not come today because she has no ride, pt will come in am at 9:00 am

## 2013-01-02 NOTE — Telephone Encounter (Signed)
I was looking at her chart and I thought that we had done a urine analysis as it was ordered but does not appear that this was done. Asked patient if she can come by and just drop off a clean catch urinalysis I would like to see this before we give her something. If there is a co-pay for this then wright that off.

## 2013-01-02 NOTE — Telephone Encounter (Signed)
Pt was informed with the below appointment, pt said she is still having same symptoms in OV yesterday. Pt asked if anything could be given?

## 2013-01-02 NOTE — Telephone Encounter (Signed)
I did check with the lab and the u/a was done it was sent out later that evening, result should be in today. Still have pt come by?

## 2013-01-03 MED ORDER — CIPROFLOXACIN HCL 250 MG PO TABS: 250.0000 mg | ORAL_TABLET | Freq: Two times a day (BID) | ORAL | Status: DC

## 2013-01-03 MED ORDER — PHENAZOPYRIDINE HCL 200 MG PO TABS: 200.0000 mg | ORAL_TABLET | Freq: Three times a day (TID) | ORAL | Status: DC | PRN

## 2013-01-03 NOTE — Telephone Encounter (Signed)
Pt informed, rx sent 

## 2013-01-03 NOTE — Telephone Encounter (Signed)
Tell patient that her urine looks like a urinary tract infection. Recommend ciprofloxacin 250 mg twice a day x7 days #14. Pyridium 200 mg 3 times daily #15 for several days until the antibiotics take effect

## 2013-01-08 ENCOUNTER — Encounter: Payer: Self-pay | Admitting: Gynecology

## 2013-01-08 ENCOUNTER — Other Ambulatory Visit: Payer: Self-pay | Admitting: Gynecology

## 2013-01-08 MED ORDER — SULFAMETHOXAZOLE-TMP DS 800-160 MG PO TABS: 1.0000 | ORAL_TABLET | Freq: Two times a day (BID) | ORAL | Status: DC

## 2013-02-14 ENCOUNTER — Other Ambulatory Visit: Payer: Self-pay | Admitting: Urology

## 2013-03-06 ENCOUNTER — Encounter (HOSPITAL_BASED_OUTPATIENT_CLINIC_OR_DEPARTMENT_OTHER): Payer: Self-pay | Admitting: *Deleted

## 2013-03-06 NOTE — Progress Notes (Signed)
To [facility] at 0730 -Hg on arrival- Instructed Npo after Mn-refrain from smoking also-to take xanax and zoloft with sip water that am.

## 2013-03-11 NOTE — H&P (Signed)
ive Problems Problems   1. Chronic interstitial cystitis without hematuria (595.1)  2. Female pelvic pain (625.9)  3. Pyuria (791.9)  4. 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 Rogers Blocker is a 53 yo WF former patient with a history of IC who was last seen in 2008 following and HOD.  She is sent back in consultation by Dr. Audie Box for worsening symptoms.  She reports a several month history of pain in her abdomen and back.  She was treated for a UTI and got 2 antibiotics after the culture showed resistance to the initial treatment.  Her symptoms then were frequency, urgency, suprapubic pain and back pain.   The symptoms didn't improve with the antibiotics.   She is now have frequency 10+ times daily.  She drinks a lot of water because she is thirsty.  She has nocturia x 1-2.  She has some pain and urgency and will have UUI if she doesn't go right away.  She has no SUI.   She has no dysuria but hurts when she voids.  She has had no hematuria.  She has a good stream and she thinks she is emptying most of the time, but she sometimes fells like she has to strain to empty.  She has tried pyridium which didn't really help.  When she was seen in 2008 she was found to have a very small bladder with no instability on UDS.  She held 115cc.   Her capacity under anesthesia was only 400cc and she had glomerulations.  She also had a dilation about 22 years ago.  She has dysparunia as well.   Past Medical History Problems   1. History of Anxiety (300.00)  2. History of Arthritis (V13.4)  3. History of depression (V11.8)  4. History of esophageal reflux (V12.79)  5. History of hypercholesterolemia (V12.29)  6. History of sleep apnea (V13.89)  7. Pyuria (791.9)  8. History of Seizure  Surgical History Problems   1. History of Cholecystectomy  2. History of Cystoscopy (For Therapy)  3. History of Hysterectomy  Current Meds  1. Azo Tabs TABS;  Therapy: (Recorded:08Dec2014) to  Recorded  2. Sertraline HCl TABS;  Therapy: (Recorded:08Dec2014) to Recorded  3. Tylenol Arthritis Pain 650 MG Oral Tablet Extended Release;  Therapy: (Recorded:08Dec2014) to Recorded  4. Vitamin B-12 TABS;  Therapy: (Recorded:08Dec2014) to Recorded  5. Xanax TABS;  Therapy: (Recorded:08Dec2014) to Recorded  Allergies No Known Allergies   1. No Known Allergies  Family History Problems   1. Family history of Adenocarcinoma Of The Cervix (V16.49) : Mother  2. Family history of Aneurysm : Father  3. Family history of Cardiac Failure : Mother  4. Family history of Death In The Family Mother : Mother  5. Family history of Family Health Status - Father's Age : Mother  6. Family history of Family Health Status Number Of Children : Mother  7. Family history of chronic obstructive pulmonary disease (V17.6) : Mother, Father  8. Family history of heart failure (V17.49) : Mother, Father  61. Family history of myocardial infarction (V17.3) : Father  10. Family history of Nephrolithiasis : Father  51. Family history of Prostate Cancer (Z30.86) : Father  Social History Problems    Denied: History of Alcohol Use   Caffeine Use   Current every day smoker (305.1)   Father deceased   Married   Mother deceased   Tobacco Use (V15.82)   Unemployed (V62.0)  Review of Systems Genitourinary,  constitutional, skin, eye, otolaryngeal, hematologic/lymphatic, cardiovascular, pulmonary, endocrine, musculoskeletal, gastrointestinal, neurological and psychiatric system(s) were reviewed and pertinent findings if present are noted.  Genitourinary: urinary frequency, urinary urgency, nocturia, incontinence, dyspareunia and initiating urination requires straining.  Gastrointestinal: nausea, vomiting, heartburn and diarrhea. The patient presents with complaints of bright red blood per rectum (from hemorrhoids).  Constitutional: night sweats and feeling tired (fatigue).  Eyes: blurred vision.  ENT:  sinus problems.  Hematologic/Lymphatic: a tendency to easily bruise.  Cardiovascular: chest pain.  Respiratory: shortness of breath and cough.  Endocrine: polydipsia.  Musculoskeletal: back pain and joint pain.  Neurological: dizziness and headache.  Psychiatric: depression and anxiety.    Vitals Vital Signs [Data Includes: Last 1 Day]  Recorded: 08Dec2014 01:01PM  Height: 5 ft 2 in Blood Pressure: 109 / 49 Temperature: 97.2 F Heart Rate: 70  Physical Exam Constitutional: Well nourished and well developed . No acute distress.  ENT:. The ears and nose are normal in appearance.  Neck: The appearance of the neck is normal and no neck mass is present.  Pulmonary: No respiratory distress and normal respiratory rhythm and effort.  Cardiovascular: Heart rate and rhythm are normal . No peripheral edema.  Abdomen: No masses are palpated. Moderate suprapubic tenderness is present. No CVA tenderness. No hernias are palpable. No hepatosplenomegaly noted.  Genitourinary:  Chaperone Present: .  Examination of the external genitalia shows normal female external genitalia and no lesions. The urethra is normal in appearance and not tender. There is no urethral mass. Vaginal exam demonstrates no abnormalities and no atrophy. No cystocele is identified. No rectocele is identified. The cervix is is absent. The uterus is absent. The adnexa are palpably normal. The bladder is tender, but not distended. Postvoid residual urine is 0 mL. The anus is normal on inspection. The perineum is normal on inspection.  Lymphatics: The femoral and inguinal nodes are not enlarged or tender.  Skin: Normal skin turgor, no visible rash and no visible skin lesions.  Neuro/Psych:. Mood and affect are appropriate. Normal sensation of the perineum/perianal region (S3,4,5).    Results/Data Urine [Data Includes: Last 1 Day]   08Dec2014  COLOR STRAW   APPEARANCE CLEAR   SPECIFIC GRAVITY 1.010   pH 6.5   GLUCOSE NEG mg/dL   BILIRUBIN NEG   KETONE NEG mg/dL  BLOOD NEG   PROTEIN NEG mg/dL  UROBILINOGEN 0.2 mg/dL  NITRITE NEG   LEUKOCYTE ESTERASE NEG    Old records or history reviewed: I have reviewed records and UA's form Dr. Audie Box and our prior encounters.  The following clinical lab reports were reviewed:  UA reviewed.    Assessment Assessed   1. Chronic interstitial cystitis without hematuria (595.1)  2. Female pelvic pain (625.9)   She has recurrent pelvic pain and voiding symptoms with some UUI.  Her bladder capacity before was only 115cc.   Plan Chronic interstitial cystitis without hematuria   1. Start: Oxycodone-Acetaminophen 5-325 MG Oral Tablet; TAKE 1 TABLET Every 6 hours  PRN  2. Follow-up Schedule Surgery Office  Follow-up  Status: Hold For - Appointment   Requested for: 08Dec2014  3. Pelvic Exam; Status:Hold For - Manual Activation; Requested for:08Dec2014;   4. PVR U/S; Status:Complete;   Done: 08Dec2014    I discussed the options for evaluation and therapy and will set her up for a cystoscopy with hydrodistention, urethral dilation and instillation of pyridium and marcaine.   I have reviewed the risks of bleeding, infection, bladder injury, retention, thrombotic events and  anesthetic complications.   I am going to give her a script for Percocet pending the procedure.

## 2013-03-12 ENCOUNTER — Ambulatory Visit (HOSPITAL_BASED_OUTPATIENT_CLINIC_OR_DEPARTMENT_OTHER)
Admission: RE | Admit: 2013-03-12 | Discharge: 2013-03-12 | Disposition: A | Discharge status: Home / Self Care | Payer: 59 | Source: Ambulatory Visit | Attending: Urology | Admitting: Urology

## 2013-03-12 ENCOUNTER — Ambulatory Visit (HOSPITAL_BASED_OUTPATIENT_CLINIC_OR_DEPARTMENT_OTHER): Payer: 59 | Admitting: Anesthesiology

## 2013-03-12 ENCOUNTER — Encounter (HOSPITAL_BASED_OUTPATIENT_CLINIC_OR_DEPARTMENT_OTHER): Payer: 59 | Admitting: Anesthesiology

## 2013-03-12 ENCOUNTER — Encounter (HOSPITAL_BASED_OUTPATIENT_CLINIC_OR_DEPARTMENT_OTHER)
Admission: RE | Disposition: A | Discharge status: Home / Self Care | Payer: Self-pay | Source: Ambulatory Visit | Attending: Urology

## 2013-03-12 ENCOUNTER — Encounter (HOSPITAL_BASED_OUTPATIENT_CLINIC_OR_DEPARTMENT_OTHER): Payer: Self-pay | Admitting: Anesthesiology

## 2013-03-12 DIAGNOSIS — E7800 Pure hypercholesterolemia, unspecified: Secondary | ICD-10-CM | POA: Insufficient documentation

## 2013-03-12 DIAGNOSIS — N35919 Unspecified urethral stricture, male, unspecified site: Secondary | ICD-10-CM | POA: Insufficient documentation

## 2013-03-12 DIAGNOSIS — Z79899 Other long term (current) drug therapy: Secondary | ICD-10-CM | POA: Insufficient documentation

## 2013-03-12 DIAGNOSIS — Z90710 Acquired absence of both cervix and uterus: Secondary | ICD-10-CM | POA: Insufficient documentation

## 2013-03-12 DIAGNOSIS — N3010 Interstitial cystitis (chronic) without hematuria: Secondary | ICD-10-CM | POA: Insufficient documentation

## 2013-03-12 DIAGNOSIS — K219 Gastro-esophageal reflux disease without esophagitis: Secondary | ICD-10-CM | POA: Insufficient documentation

## 2013-03-12 DIAGNOSIS — M129 Arthropathy, unspecified: Secondary | ICD-10-CM | POA: Insufficient documentation

## 2013-03-12 DIAGNOSIS — G4730 Sleep apnea, unspecified: Secondary | ICD-10-CM | POA: Insufficient documentation

## 2013-03-12 DIAGNOSIS — Z9089 Acquired absence of other organs: Secondary | ICD-10-CM | POA: Insufficient documentation

## 2013-03-12 HISTORY — DX: Shortness of breath: R06.02

## 2013-03-12 HISTORY — DX: Anxiety disorder, unspecified: F41.9

## 2013-03-12 HISTORY — DX: Major depressive disorder, single episode, unspecified: F32.9

## 2013-03-12 HISTORY — DX: Depression, unspecified: F32.A

## 2013-03-12 HISTORY — PX: CYSTO WITH HYDRODISTENSION: SHX5453

## 2013-03-12 HISTORY — PX: CYSTOSCOPY WITH URETHRAL DILATATION: SHX5125

## 2013-03-12 HISTORY — DX: Cerebral infarction, unspecified: I63.9

## 2013-03-12 SURGERY — CYSTOSCOPY, WITH BLADDER HYDRODISTENSION
Anesthesia: General | Site: Bladder

## 2013-03-12 MED ORDER — DEXAMETHASONE SODIUM PHOSPHATE 4 MG/ML IJ SOLN
INTRAMUSCULAR | Status: DC | PRN
  Administered 2013-03-12: 10 mg via INTRAVENOUS

## 2013-03-12 MED ORDER — FENTANYL CITRATE 0.05 MG/ML IJ SOLN
INTRAMUSCULAR | Status: AC
  Filled 2013-03-12: qty 2

## 2013-03-12 MED ORDER — BUPIVACAINE HCL 0.5 % IJ SOLN: INTRAMUSCULAR | Status: DC | PRN

## 2013-03-12 MED ORDER — CIPROFLOXACIN IN D5W 400 MG/200ML IV SOLN
400.0000 mg | INTRAVENOUS | Status: AC
  Administered 2013-03-12: 400 mg via INTRAVENOUS
  Filled 2013-03-12: qty 200

## 2013-03-12 MED ORDER — ACETAMINOPHEN 650 MG RE SUPP
650.0000 mg | RECTAL | Status: DC | PRN
  Filled 2013-03-12: qty 1

## 2013-03-12 MED ORDER — ONDANSETRON HCL 4 MG/2ML IJ SOLN
4.0000 mg | Freq: Four times a day (QID) | INTRAMUSCULAR | Status: DC | PRN
  Filled 2013-03-12: qty 2

## 2013-03-12 MED ORDER — PHENAZOPYRIDINE HCL 100 MG PO TABS
ORAL_TABLET | ORAL | Status: AC
  Filled 2013-03-12: qty 2

## 2013-03-12 MED ORDER — PROPOFOL 10 MG/ML IV BOLUS
INTRAVENOUS | Status: DC | PRN
  Administered 2013-03-12: 150 mg via INTRAVENOUS
  Administered 2013-03-12: 50 mg via INTRAVENOUS

## 2013-03-12 MED ORDER — HYDROCODONE-ACETAMINOPHEN 5-325 MG PO TABS: 1.0000 | ORAL_TABLET | Freq: Four times a day (QID) | ORAL | Status: DC | PRN

## 2013-03-12 MED ORDER — KETOROLAC TROMETHAMINE 30 MG/ML IJ SOLN
INTRAMUSCULAR | Status: DC | PRN
  Administered 2013-03-12: 30 mg via INTRAVENOUS

## 2013-03-12 MED ORDER — PHENAZOPYRIDINE HCL 200 MG PO TABS
200.0000 mg | ORAL_TABLET | Freq: Three times a day (TID) | ORAL | Status: DC
  Administered 2013-03-12: 200 mg via ORAL
  Filled 2013-03-12: qty 1

## 2013-03-12 MED ORDER — MIDAZOLAM HCL 2 MG/2ML IJ SOLN
INTRAMUSCULAR | Status: AC
  Filled 2013-03-12: qty 2

## 2013-03-12 MED ORDER — FENTANYL CITRATE 0.05 MG/ML IJ SOLN
25.0000 ug | INTRAMUSCULAR | Status: DC | PRN
  Filled 2013-03-12: qty 1

## 2013-03-12 MED ORDER — LACTATED RINGERS IV SOLN
INTRAVENOUS | Status: DC
  Administered 2013-03-12: 09:00:00 via INTRAVENOUS
  Filled 2013-03-12: qty 1000

## 2013-03-12 MED ORDER — SODIUM CHLORIDE 0.9 % IJ SOLN
3.0000 mL | Freq: Two times a day (BID) | INTRAMUSCULAR | Status: DC
  Filled 2013-03-12: qty 3

## 2013-03-12 MED ORDER — LIDOCAINE HCL (CARDIAC) 20 MG/ML IV SOLN
INTRAVENOUS | Status: DC | PRN
  Administered 2013-03-12: 50 mg via INTRAVENOUS

## 2013-03-12 MED ORDER — OXYCODONE HCL 5 MG PO TABS
ORAL_TABLET | ORAL | Status: AC
  Filled 2013-03-12: qty 1

## 2013-03-12 MED ORDER — SODIUM CHLORIDE 0.9 % IV SOLN
250.0000 mL | INTRAVENOUS | Status: DC | PRN
  Filled 2013-03-12: qty 250

## 2013-03-12 MED ORDER — FENTANYL CITRATE 0.05 MG/ML IJ SOLN
INTRAMUSCULAR | Status: DC | PRN
  Administered 2013-03-12 (×2): 50 ug via INTRAVENOUS
  Administered 2013-03-12: 100 ug via INTRAVENOUS

## 2013-03-12 MED ORDER — PROMETHAZINE HCL 25 MG/ML IJ SOLN
6.2500 mg | INTRAMUSCULAR | Status: DC | PRN
  Filled 2013-03-12: qty 1

## 2013-03-12 MED ORDER — OXYCODONE HCL 5 MG PO TABS
5.0000 mg | ORAL_TABLET | ORAL | Status: DC | PRN
  Administered 2013-03-12: 5 mg via ORAL
  Filled 2013-03-12: qty 2

## 2013-03-12 MED ORDER — SODIUM CHLORIDE 0.9 % IJ SOLN
3.0000 mL | INTRAMUSCULAR | Status: DC | PRN
  Filled 2013-03-12: qty 3

## 2013-03-12 MED ORDER — STERILE WATER FOR IRRIGATION IR SOLN
Status: DC | PRN
  Administered 2013-03-12: 3000 mL

## 2013-03-12 MED ORDER — BELLADONNA ALKALOIDS-OPIUM 16.2-60 MG RE SUPP
RECTAL | Status: AC
  Filled 2013-03-12: qty 1

## 2013-03-12 MED ORDER — MIDAZOLAM HCL 5 MG/5ML IJ SOLN
INTRAMUSCULAR | Status: DC | PRN
  Administered 2013-03-12: 2 mg via INTRAVENOUS

## 2013-03-12 MED ORDER — PHENAZOPYRIDINE HCL 200 MG PO TABS: 200.0000 mg | ORAL_TABLET | Freq: Three times a day (TID) | ORAL | Status: DC | PRN

## 2013-03-12 MED ORDER — ONDANSETRON HCL 4 MG/2ML IJ SOLN
INTRAMUSCULAR | Status: DC | PRN
  Administered 2013-03-12: 4 mg via INTRAVENOUS

## 2013-03-12 MED ORDER — PHENAZOPYRIDINE HCL 200 MG PO TABS
ORAL | Status: DC | PRN
  Administered 2013-03-12: 09:00:00 via INTRAVESICAL

## 2013-03-12 MED ORDER — ACETAMINOPHEN 325 MG PO TABS
650.0000 mg | ORAL_TABLET | ORAL | Status: DC | PRN
  Filled 2013-03-12: qty 2

## 2013-03-12 SURGICAL SUPPLY — 26 items
BAG DRAIN URO-CYSTO SKYTR STRL (DRAIN) ×2 IMPLANT
BALLN NEPHROSTOMY (BALLOONS) ×2
BALLOON NEPHROSTOMY (BALLOONS) ×1 IMPLANT
CANISTER SUCT LVC 12 LTR MEDI- (MISCELLANEOUS) ×2 IMPLANT
CATH FOLEY 2WAY SLVR  5CC 16FR (CATHETERS)
CATH FOLEY 2WAY SLVR 5CC 16FR (CATHETERS) IMPLANT
CATH ROBINSON RED A/P 16FR (CATHETERS) ×2 IMPLANT
CLOTH BEACON ORANGE TIMEOUT ST (SAFETY) ×2 IMPLANT
DRAPE CAMERA CLOSED 9X96 (DRAPES) ×2 IMPLANT
ELECT REM PT RETURN 9FT ADLT (ELECTROSURGICAL) ×2
ELECTRODE REM PT RTRN 9FT ADLT (ELECTROSURGICAL) ×1 IMPLANT
GLOVE BIOGEL PI IND STRL 7.5 (GLOVE) ×2 IMPLANT
GLOVE BIOGEL PI INDICATOR 7.5 (GLOVE) ×2
GLOVE SURG SS PI 7.5 STRL IVOR (GLOVE) ×2 IMPLANT
GLOVE SURG SS PI 8.0 STRL IVOR (GLOVE) ×2 IMPLANT
GOWN PREVENTION PLUS LG XLONG (DISPOSABLE) ×2 IMPLANT
GOWN STRL REIN XL XLG (GOWN DISPOSABLE) ×2 IMPLANT
GUIDEWIRE STR DUAL SENSOR (WIRE) IMPLANT
NDL SAFETY ECLIPSE 18X1.5 (NEEDLE) ×1 IMPLANT
NEEDLE HYPO 18GX1.5 SHARP (NEEDLE) ×1
NEEDLE HYPO 22GX1.5 SAFETY (NEEDLE) IMPLANT
NS IRRIG 500ML POUR BTL (IV SOLUTION) IMPLANT
PACK CYSTOSCOPY (CUSTOM PROCEDURE TRAY) ×2 IMPLANT
SYR 20CC LL (SYRINGE) IMPLANT
SYR 30ML LL (SYRINGE) ×2 IMPLANT
WATER STERILE IRR 3000ML UROMA (IV SOLUTION) ×2 IMPLANT

## 2013-03-12 NOTE — Discharge Instructions (Signed)
Cystoscopy, Care After °Refer to this sheet in the next few weeks. These instructions provide you with information on caring for yourself after your procedure. Your caregiver may also give you more specific instructions. Your treatment has been planned according to current medical practices, but problems sometimes occur. Call your caregiver if you have any problems or questions after your procedure. °HOME CARE INSTRUCTIONS  °Things you can do to ease any discomfort after your procedure include: °· Drinking enough water and fluids to keep your urine clear or pale yellow. °· Taking a warm bath to relieve any burning feelings. °SEEK IMMEDIATE MEDICAL CARE IF:  °· You have an increase in blood in your urine. °· You notice blood clots in your urine. °· You have difficulty passing urine. °· You have the chills. °· You have abdominal pain. °· You have a fever or persistent symptoms for more than 2 3 days. °· You have a fever and your symptoms suddenly get worse. °MAKE SURE YOU:  °· Understand these instructions. °· Will watch your condition. °· Will get help right away if you are not doing well or get worse. °Document Released: 09/10/2004 Document Revised: 10/24/2012 Document Reviewed: 08/15/2011 °[employer]® Patient Information ©2014 [employer]. ° °Post Anesthesia Home Care Instructions ° °Activity: °Get plenty of rest for the remainder of the day. A responsible adult should stay with you for 24 hours following the procedure.  °For the next 24 hours, DO NOT: °-Drive a car °-Operate machinery °-Drink alcoholic beverages °-Take any medication unless instructed by your physician °-Make any legal decisions or sign important papers. ° °Meals: °Start with liquid foods such as gelatin or soup. Progress to regular foods as tolerated. Avoid greasy, spicy, heavy foods. If nausea and/or vomiting occur, drink only clear liquids until the nausea and/or vomiting subsides. Call your physician if vomiting continues. ° °Special  Instructions/Symptoms: °Your throat may feel dry or sore from the anesthesia or the breathing tube placed in your throat during surgery. If this causes discomfort, gargle with warm salt water. The discomfort should disappear within 24 hours. ° °

## 2013-03-12 NOTE — Transfer of Care (Signed)
Immediate Anesthesia Transfer of Care Note  Patient: Lindsay Joyce  Procedure(s) Performed: Procedure(s): CYSTOSCOPY/HYDRODISTENSION/INSTILLATION OF MARCAINE AND PYRIDIUM (N/A) CYSTOSCOPY WITH URETHRAL DILATATION (N/A)  Patient Location: PACU  Anesthesia Type:General  Level of Consciousness: awake and oriented  Airway & Oxygen Therapy: Patient Spontanous Breathing and Patient connected to nasal cannula oxygen  Post-op Assessment: Report given to PACU RN  Post vital signs: Reviewed and stable  Complications: No apparent anesthesia complications

## 2013-03-12 NOTE — Anesthesia Preprocedure Evaluation (Addendum)
Anesthesia Evaluation  Patient identified by MRN, date of birth, ID band Patient awake    Reviewed: Allergy & Precautions, H&P , NPO status , Patient's Chart, lab work & pertinent test results  Airway Mallampati: II TM Distance: >3 FB Neck ROM: Full    Dental no notable dental hx.    Pulmonary shortness of breath, Current Smoker,  breath sounds clear to auscultation  + wheezing      Cardiovascular negative cardio ROS  Rhythm:Regular Rate:Normal     Neuro/Psych PSYCHIATRIC DISORDERS Anxiety Depression CVA    GI/Hepatic negative GI ROS, Neg liver ROS,   Endo/Other  negative endocrine ROS  Renal/GU negative Renal ROS  negative genitourinary   Musculoskeletal negative musculoskeletal ROS (+)   Abdominal   Peds negative pediatric ROS (+)  Hematology negative hematology ROS (+)   Anesthesia Other Findings   Reproductive/Obstetrics negative OB ROS                         Anesthesia Physical Anesthesia Plan  ASA: III  Anesthesia Plan: General   Post-op Pain Management:    Induction: Intravenous  Airway Management Planned: Mask  Additional Equipment:   Intra-op Plan:   Post-operative Plan: Extubation in OR  Informed Consent: I have reviewed the patients History and Physical, chart, labs and discussed the procedure including the risks, benefits and alternatives for the proposed anesthesia with the patient or authorized representative who has indicated his/her understanding and acceptance.   Dental advisory given  Plan Discussed with: CRNA  Anesthesia Plan Comments: (Discussed her wheezing with her and her husband. She feels her nerves are affecting her breathing. She has used an albuterol inhaler in the past, and is familiar with its proper usage.  We discussed cancelling and rescheduling this procedure, but she feels well enough to proceed. We discussed that her wheezing could get worse  requiring more treatment. This is a minor, short procedure today and I feel the benefits outweigh the risks of proceeding.)       Anesthesia Quick Evaluation

## 2013-03-12 NOTE — Brief Op Note (Signed)
03/12/2013  9:07 AM  PATIENT:  Deeann CreeAngela B Ferris  53 y.o. female  PRE-OPERATIVE DIAGNOSIS:  insterstitial cystitis/history of urethral stricture  POST-OPERATIVE DIAGNOSIS:  insterstitial cystitis/history of urethral stricture  PROCEDURE:  Procedure(s): CYSTOSCOPY/HYDRODISTENSION/INSTILLATION OF MARCAINE AND PYRIDIUM (N/A) CYSTOSCOPY WITH URETHRAL DILATATION (N/A)  SURGEON:  Surgeon(s) and Role:    * Bjorn PippinJohn Simran Bomkamp, MD - Primary  PHYSICIAN ASSISTANT:   ASSISTANTS: none   ANESTHESIA:   general  EBL:  Total I/O In: 100 [I.V.:100] Out: -   BLOOD ADMINISTERED:none  DRAINS: none   LOCAL MEDICATIONS USED:  MARCAINE     SPECIMEN:  No Specimen  DISPOSITION OF SPECIMEN:  N/A  COUNTS:  YES  TOURNIQUET:  * No tourniquets in log *  DICTATION: .Other Dictation: Dictation Number [id number]  PLAN OF CARE: Discharge to home after PACU  PATIENT DISPOSITION:  PACU - hemodynamically stable.   Delay start of Pharmacological VTE agent (>24hrs) due to surgical blood loss or risk of bleeding: not applicable

## 2013-03-12 NOTE — Anesthesia Postprocedure Evaluation (Signed)
  Anesthesia Post-op Note  Patient: Lindsay Joyce  Procedure(s) Performed: Procedure(s) (LRB): CYSTOSCOPY/HYDRODISTENSION/INSTILLATION OF MARCAINE AND PYRIDIUM (N/A) CYSTOSCOPY WITH URETHRAL DILATATION (N/A)  Patient Location: PACU  Anesthesia Type: General  Level of Consciousness: awake and alert   Airway and Oxygen Therapy: Patient Spontanous Breathing  Post-op Pain: mild  Post-op Assessment: Post-op Vital signs reviewed, Patient's Cardiovascular Status Stable, Respiratory Function Stable, Patent Airway and No signs of Nausea or vomiting  Last Vitals:  Filed Vitals:   03/12/13 1000  BP: 112/93  Pulse: 61  Temp:   Resp: 14    Post-op Vital Signs: stable   Complications: No apparent anesthesia complications. Denies any increased SOB nor dyspnea.

## 2013-03-12 NOTE — Interval H&P Note (Signed)
History and Physical Interval Note:  03/12/2013 8:25 AM  Lindsay Joyce  has presented today for surgery, with the diagnosis of insterstitial cystitis/history of urethral stricture  The various methods of treatment have been discussed with the patient and family. After consideration of risks, benefits and other options for treatment, the patient has consented to  Procedure(s): CYSTOSCOPY/HYDRODISTENSION/INSTILLATION OF MARCAINE AND PYRIDIUM (N/A) CYSTOSCOPY WITH URETHRAL DILATATION (N/A) as a surgical intervention .  The patient's history has been reviewed, patient examined, no change in status, stable for surgery.  I have reviewed the patient's chart and labs.  Questions were answered to the patient's satisfaction.     Carles Florea J

## 2013-03-13 ENCOUNTER — Encounter (HOSPITAL_BASED_OUTPATIENT_CLINIC_OR_DEPARTMENT_OTHER): Payer: Self-pay | Admitting: Urology

## 2013-03-13 NOTE — Op Note (Signed)
NAMSherlon Handing:  FERRIS, Enyla               ACCOUNT NO.:  [id number]  MEDICAL RECORD NO.:  [id number]  LOCATION:                                 FACILITY:  PHYSICIAN:  Excell SeltzerJohn J. Annabell HowellsWrenn, M.D.    DATE OF BIRTH:  06/16/1960  DATE OF PROCEDURE:  03/12/2013 DATE OF DISCHARGE:                              OPERATIVE REPORT   PROCEDURE:  Cystoscopy with hydrodistention of bladder, urethral dilation and instillation for even Marcaine.  PREOPERATIVE DIAGNOSIS:  Interstitial cystitis and urethral stenosis.  POSTOPERATIVE DIAGNOSIS:  Interstitial cystitis and urethral stenosis.  SURGEON:  Excell SeltzerJohn J. Annabell HowellsWrenn, MD  ANESTHESIA:  General.  SPECIMEN:  None.  DRAINS:  None.  COMPLICATIONS:  None.  INDICATIONS:  Lindsay Joyce is a 53 year old white female with a history of interstitial cystitis.  She has responded well with hydrodistention in the past.  She presents today for a repeat hydrodistention.  She also had urethral stenosis and dilation and was felt to be indicated.  FINDINGS OF PROCEDURE:  She was given Cipro.  She was taken to the operating room where general anesthetic was induced.  She was placed in lithotomy position and fitted with PAS hose.  Perineum and genitalia were prepped with Betadine solution.  She was draped in usual sterile fashion.  Cystoscopy was performed using a 22-French scope and 12-degree lens. Examination revealed a normal urethra.  The bladder wall had mild trabeculation but no tumor, stones, or inflammation.  Ureteral orifices were unremarkable.  The bladder was filled under 80 cm of water pressure to capacity.  This was held for 3 minutes, and the bladder was drained.  Her capacity under anesthesia was only 500 mL.  She did have glomerulations consistent with interstitial cystitis on the mucosa post dilation.  The urethra was then calibrated from 24-30-French.  There was only minimal stenosis.  She then underwent instillation of 30 mL of 0.25% Marcaine with 400  mg crushed Pyridium.  She was taken down from lithotomy position.  Her anesthetic was reversed.  She was moved to recovery room in stable condition.  There were no complications.     Excell SeltzerJohn J. Annabell HowellsWrenn, M.D.     JJW/MEDQ  D:  03/12/2013  T:  03/13/2013  Job:  [id number]

## 2013-03-14 LAB — POCT HEMOGLOBIN-HEMACUE: Hemoglobin: 14.4 g/dL (ref 12.0–15.0)

## 2014-01-06 ENCOUNTER — Encounter (HOSPITAL_BASED_OUTPATIENT_CLINIC_OR_DEPARTMENT_OTHER): Payer: Self-pay | Admitting: Urology

## 2014-04-18 ENCOUNTER — Encounter: Payer: Self-pay | Admitting: Gynecology

## 2014-04-18 ENCOUNTER — Ambulatory Visit (INDEPENDENT_AMBULATORY_CARE_PROVIDER_SITE_OTHER): Payer: 59 | Admitting: Gynecology

## 2014-04-18 VITALS — BP 138/80 | Temp 99.6°F | Wt 153.0 lb

## 2014-04-18 DIAGNOSIS — R350 Frequency of micturition: Secondary | ICD-10-CM

## 2014-04-18 DIAGNOSIS — R1084 Generalized abdominal pain: Secondary | ICD-10-CM

## 2014-04-18 LAB — URINALYSIS W MICROSCOPIC + REFLEX CULTURE
Bilirubin Urine: NEGATIVE
Glucose, UA: NEGATIVE mg/dL
HGB URINE DIPSTICK: NEGATIVE
Ketones, ur: NEGATIVE mg/dL
Leukocytes, UA: NEGATIVE
NITRITE: NEGATIVE
PH: 6.5 (ref 5.0–8.0)
PROTEIN: NEGATIVE mg/dL
SPECIFIC GRAVITY, URINE: 1.02 (ref 1.005–1.030)
Urobilinogen, UA: 0.2 mg/dL (ref 0.0–1.0)

## 2014-04-18 NOTE — Patient Instructions (Signed)
Follow up with Dr. Andrey CampanileWilson in reference to your abdominal pain. Possibilities include the urinary system or the GI system. Follow up with the emergency room if your pain worsens

## 2014-04-18 NOTE — Addendum Note (Signed)
Addended by: Kem ParkinsonBARNES, Cashlyn Huguley on: 04/18/2014 12:55 PM   Modules accepted: Orders

## 2014-04-18 NOTE — Progress Notes (Signed)
Lindsay Joyce 1960/08/11 [id number]        54 y.o.  [id number] presents complaining of lower abdominal pain, urinary frequency and discomfort with urination. Also notes some diarrhea/nausea. No vomiting. Reports temperature 101 yesterday. Feels like she's getting a urinary tract infection. History of interstitial cystitis with cystoscopy/hydrodistention 03/2013 by Dr. Annabell HowellsWrenn. Has an appointment to see her primary physician 2 weeks. Status post LAVH BSO in the past.  Past medical history,surgical history, problem list, medications, allergies, family history and social history were all reviewed and documented in the EPIC chart.  Directed ROS with pertinent positives and negatives documented in the history of present illness/assessment and plan.  Exam: Bari MantisKim Alexis assistant Temperature 99.6 blood pressure 138/80 General appearance:  Normal Spine straight with discomfort on palpation. Abdomen with active bowel sounds, soft with mild tenderness lower abdomen. No rebound guarding masses Pelvic external BUS vagina normal. Bimanual without masses or tenderness area and rectal exam is normal.  Assessment/Plan:  54 y.o. [id number] with symptoms questioning UTI although urinalysis is completely negative. Reviewed differential to include urinary and GI. No GYN organs to attribute pain to.  Possible diverticulitis/interstitial cystitis reviewed. Options to follow up with her urologist or gastroenterologist reviewed. As she or he has an appointment to see her primary have suggested she call him and see if she cannot accelerate that appointment to see him sooner. Possible other studies to include CT scan reviewed. Need to be evaluated in the emergency room if her pain worsens persistent fevers. Patient requesting narcotic pain medication which I declined given I am unclear as to what the etiology of her pain is and this needs to be further evaluated before treatment.     Dara LordsFONTAINE,Ricci Paff P MD, 11:34 AM  04/18/2014

## 2014-06-03 ENCOUNTER — Encounter: Payer: Self-pay | Admitting: Gynecology

## 2015-05-23 ENCOUNTER — Encounter (HOSPITAL_COMMUNITY): Payer: Self-pay | Admitting: *Deleted

## 2015-05-23 ENCOUNTER — Emergency Department (HOSPITAL_COMMUNITY): Payer: No Typology Code available for payment source

## 2015-05-23 ENCOUNTER — Emergency Department (HOSPITAL_COMMUNITY): Payer: Self-pay

## 2015-05-23 ENCOUNTER — Inpatient Hospital Stay (HOSPITAL_COMMUNITY)
Admission: EM | Admit: 2015-05-23 | Discharge: 2015-05-28 | DRG: 552 | Disposition: A | Discharge status: Home Health | Payer: Self-pay | Attending: General Surgery | Admitting: General Surgery

## 2015-05-23 DIAGNOSIS — F1290 Cannabis use, unspecified, uncomplicated: Secondary | ICD-10-CM | POA: Diagnosis present

## 2015-05-23 DIAGNOSIS — F329 Major depressive disorder, single episode, unspecified: Secondary | ICD-10-CM | POA: Diagnosis present

## 2015-05-23 DIAGNOSIS — F419 Anxiety disorder, unspecified: Secondary | ICD-10-CM | POA: Diagnosis present

## 2015-05-23 DIAGNOSIS — Z79899 Other long term (current) drug therapy: Secondary | ICD-10-CM

## 2015-05-23 DIAGNOSIS — S1093XA Contusion of unspecified part of neck, initial encounter: Secondary | ICD-10-CM | POA: Diagnosis present

## 2015-05-23 DIAGNOSIS — N3010 Interstitial cystitis (chronic) without hematuria: Secondary | ICD-10-CM | POA: Diagnosis present

## 2015-05-23 DIAGNOSIS — F17210 Nicotine dependence, cigarettes, uncomplicated: Secondary | ICD-10-CM | POA: Diagnosis present

## 2015-05-23 DIAGNOSIS — S32019A Unspecified fracture of first lumbar vertebra, initial encounter for closed fracture: Principal | ICD-10-CM | POA: Diagnosis present

## 2015-05-23 DIAGNOSIS — G8929 Other chronic pain: Secondary | ICD-10-CM | POA: Diagnosis present

## 2015-05-23 DIAGNOSIS — K567 Ileus, unspecified: Secondary | ICD-10-CM | POA: Diagnosis present

## 2015-05-23 DIAGNOSIS — S32000A Wedge compression fracture of unspecified lumbar vertebra, initial encounter for closed fracture: Secondary | ICD-10-CM

## 2015-05-23 DIAGNOSIS — Z8673 Personal history of transient ischemic attack (TIA), and cerebral infarction without residual deficits: Secondary | ICD-10-CM

## 2015-05-23 DIAGNOSIS — M545 Low back pain: Secondary | ICD-10-CM | POA: Diagnosis present

## 2015-05-23 DIAGNOSIS — Z7982 Long term (current) use of aspirin: Secondary | ICD-10-CM

## 2015-05-23 LAB — CBC
HCT: 45.3 % (ref 36.0–46.0)
Hemoglobin: 14.6 g/dL (ref 12.0–15.0)
MCH: 28.9 pg (ref 26.0–34.0)
MCHC: 32.2 g/dL (ref 30.0–36.0)
MCV: 89.5 fL (ref 78.0–100.0)
PLATELETS: 339 10*3/uL (ref 150–400)
RBC: 5.06 MIL/uL (ref 3.87–5.11)
RDW: 14.6 % (ref 11.5–15.5)
WBC: 17.8 10*3/uL — AB (ref 4.0–10.5)

## 2015-05-23 LAB — COMPREHENSIVE METABOLIC PANEL
ALK PHOS: 25 U/L — AB (ref 38–126)
ALT: 14 U/L (ref 14–54)
ANION GAP: 10 (ref 5–15)
AST: 32 U/L (ref 15–41)
Albumin: 3.7 g/dL (ref 3.5–5.0)
BILIRUBIN TOTAL: 1 mg/dL (ref 0.3–1.2)
BUN: 6 mg/dL (ref 6–20)
CALCIUM: 8.8 mg/dL — AB (ref 8.9–10.3)
CO2: 22 mmol/L (ref 22–32)
CREATININE: 1.03 mg/dL — AB (ref 0.44–1.00)
Chloride: 109 mmol/L (ref 101–111)
Glucose, Bld: 112 mg/dL — ABNORMAL HIGH (ref 65–99)
Potassium: 5.1 mmol/L (ref 3.5–5.1)
Sodium: 141 mmol/L (ref 135–145)
TOTAL PROTEIN: 6.6 g/dL (ref 6.5–8.1)

## 2015-05-23 LAB — ETHANOL: Alcohol, Ethyl (B): <5 mg/dL (ref <5)

## 2015-05-23 MED ORDER — HYDROMORPHONE HCL 1 MG/ML IJ SOLN
INTRAMUSCULAR | Status: AC
  Filled 2015-05-23: qty 1

## 2015-05-23 MED ORDER — SODIUM CHLORIDE 0.9 % IV SOLN
1000.0000 mL | INTRAVENOUS | Status: DC
  Administered 2015-05-24 – 2015-05-25 (×2): 1000 mL via INTRAVENOUS

## 2015-05-23 MED ORDER — MORPHINE SULFATE (PF) 2 MG/ML IV SOLN
INTRAVENOUS | Status: AC
  Administered 2015-05-23: 8 mg via INTRAVENOUS
  Filled 2015-05-23: qty 4

## 2015-05-23 MED ORDER — SODIUM CHLORIDE 0.9 % IV SOLN
1000.0000 mL | Freq: Once | INTRAVENOUS | Status: AC
  Administered 2015-05-23: 1000 mL via INTRAVENOUS

## 2015-05-23 MED ORDER — MORPHINE SULFATE (PF) 4 MG/ML IV SOLN
8.0000 mg | Freq: Once | INTRAVENOUS | Status: DC
Start: 2015-05-23 — End: 2015-05-28

## 2015-05-23 MED ORDER — ONDANSETRON HCL 4 MG/2ML IJ SOLN
4.0000 mg | Freq: Four times a day (QID) | INTRAMUSCULAR | Status: DC | PRN
  Administered 2015-05-24 – 2015-05-26 (×4): 4 mg via INTRAVENOUS
  Filled 2015-05-23 (×4): qty 2

## 2015-05-23 MED ORDER — HYDROMORPHONE HCL 1 MG/ML IJ SOLN
1.0000 mg | Freq: Once | INTRAMUSCULAR | Status: AC
  Administered 2015-05-23: 1 mg via INTRAVENOUS
  Filled 2015-05-23: qty 1

## 2015-05-23 MED ORDER — HYDROMORPHONE HCL 1 MG/ML IJ SOLN
1.0000 mg | Freq: Once | INTRAMUSCULAR | Status: AC
  Administered 2015-05-23: 1 mg via INTRAVENOUS

## 2015-05-23 MED ORDER — SODIUM CHLORIDE 0.9 % IV BOLUS (SEPSIS)
1000.0000 mL | Freq: Once | INTRAVENOUS | Status: AC
  Administered 2015-05-24: 1000 mL via INTRAVENOUS

## 2015-05-23 MED ORDER — IOHEXOL 300 MG/ML  SOLN
100.0000 mL | Freq: Once | INTRAMUSCULAR | Status: AC | PRN
  Administered 2015-05-23: 100 mL via INTRAVENOUS

## 2015-05-23 MED ORDER — IOHEXOL 350 MG/ML SOLN
50.0000 mL | Freq: Once | INTRAVENOUS | Status: AC | PRN
  Administered 2015-05-23: 50 mL via INTRAVENOUS

## 2015-05-23 MED ORDER — METHOCARBAMOL 1000 MG/10ML IJ SOLN
1000.0000 mg | Freq: Three times a day (TID) | INTRAVENOUS | Status: DC | PRN
  Administered 2015-05-25 – 2015-05-26 (×2): 1000 mg via INTRAVENOUS
  Filled 2015-05-23 (×4): qty 10

## 2015-05-23 MED ORDER — ENOXAPARIN SODIUM 40 MG/0.4ML ~~LOC~~ SOLN
40.0000 mg | SUBCUTANEOUS | Status: DC
  Administered 2015-05-24 – 2015-05-28 (×5): 40 mg via SUBCUTANEOUS
  Filled 2015-05-23 (×5): qty 0.4

## 2015-05-23 MED ORDER — LORAZEPAM 2 MG/ML IJ SOLN
1.0000 mg | Freq: Once | INTRAMUSCULAR | Status: AC
  Administered 2015-05-23: 1 mg via INTRAVENOUS
  Filled 2015-05-23: qty 1

## 2015-05-23 MED ORDER — ALPRAZOLAM 0.25 MG PO TABS
1.0000 mg | ORAL_TABLET | Freq: Three times a day (TID) | ORAL | Status: DC
  Administered 2015-05-24 (×4): 2 mg via ORAL
  Administered 2015-05-25 (×2): 1 mg via ORAL
  Administered 2015-05-25 – 2015-05-26 (×2): 2 mg via ORAL
  Administered 2015-05-26 (×2): 1 mg via ORAL
  Administered 2015-05-27 – 2015-05-28 (×4): 2 mg via ORAL
  Filled 2015-05-23 (×6): qty 8
  Filled 2015-05-23: qty 4
  Filled 2015-05-23 (×2): qty 8
  Filled 2015-05-23: qty 4
  Filled 2015-05-23 (×2): qty 8
  Filled 2015-05-23 (×2): qty 4

## 2015-05-23 MED ORDER — PANTOPRAZOLE SODIUM 40 MG PO TBEC
40.0000 mg | DELAYED_RELEASE_TABLET | Freq: Every day | ORAL | Status: DC
  Administered 2015-05-24 – 2015-05-28 (×5): 40 mg via ORAL
  Filled 2015-05-23 (×5): qty 1

## 2015-05-23 MED ORDER — ACETAMINOPHEN 500 MG PO TABS
1000.0000 mg | ORAL_TABLET | Freq: Four times a day (QID) | ORAL | Status: DC | PRN
  Administered 2015-05-25 – 2015-05-27 (×3): 1000 mg via ORAL
  Filled 2015-05-23 (×3): qty 2

## 2015-05-23 MED ORDER — SERTRALINE HCL 100 MG PO TABS
100.0000 mg | ORAL_TABLET | Freq: Two times a day (BID) | ORAL | Status: DC
Start: 2015-05-23 — End: 2015-05-28
  Administered 2015-05-24 – 2015-05-28 (×10): 100 mg via ORAL
  Filled 2015-05-23 (×10): qty 1

## 2015-05-23 MED ORDER — OXYCODONE HCL 5 MG PO TABS
5.0000 mg | ORAL_TABLET | ORAL | Status: DC | PRN
  Administered 2015-05-24 – 2015-05-26 (×7): 5 mg via ORAL
  Filled 2015-05-23 (×7): qty 1

## 2015-05-23 MED ORDER — HYDROMORPHONE HCL 1 MG/ML IJ SOLN
1.0000 mg | INTRAMUSCULAR | Status: DC | PRN
  Administered 2015-05-24 (×2): 1 mg via INTRAVENOUS
  Administered 2015-05-24 (×3): 2 mg via INTRAVENOUS
  Administered 2015-05-24 (×3): 1 mg via INTRAVENOUS
  Administered 2015-05-24 – 2015-05-25 (×2): 2 mg via INTRAVENOUS
  Administered 2015-05-25: 1 mg via INTRAVENOUS
  Administered 2015-05-25: 2 mg via INTRAVENOUS
  Administered 2015-05-25: 1 mg via INTRAVENOUS
  Administered 2015-05-25 (×2): 2 mg via INTRAVENOUS
  Administered 2015-05-25 (×2): 1 mg via INTRAVENOUS
  Administered 2015-05-26: 2 mg via INTRAVENOUS
  Filled 2015-05-23 (×2): qty 1
  Filled 2015-05-23: qty 2
  Filled 2015-05-23: qty 1
  Filled 2015-05-23: qty 2
  Filled 2015-05-23: qty 1
  Filled 2015-05-23: qty 2
  Filled 2015-05-23 (×2): qty 1
  Filled 2015-05-23: qty 2
  Filled 2015-05-23: qty 1
  Filled 2015-05-23: qty 2
  Filled 2015-05-23: qty 1
  Filled 2015-05-23 (×4): qty 2
  Filled 2015-05-23: qty 1

## 2015-05-23 MED ORDER — ONDANSETRON HCL 4 MG PO TABS
4.0000 mg | ORAL_TABLET | Freq: Four times a day (QID) | ORAL | Status: DC | PRN
  Administered 2015-05-25: 4 mg via ORAL
  Filled 2015-05-23: qty 1

## 2015-05-23 NOTE — ED Notes (Signed)
Pt continues to yell out. Pt made aware that she had received pain medication (see MAR). Pt refused transport to CT due to pain. This RN went in room. Pt had removed c-collar. i reapplied collar and informed pt and family of the importance of wearing it until scans were complete. Pt ct returned for pickup and took pt. Pt noted to have bruising to left neck and clavicle. Pt continued to yell and states "i wont be there long if you dont give me something for pain." MD aware of pain. Will reevaluate upon return from CT.
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
## 2015-05-23 NOTE — Progress Notes (Signed)
RN called report. 
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
## 2015-05-23 NOTE — ED Notes (Signed)
Admitting at bedside 
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
## 2015-05-23 NOTE — ED Notes (Signed)
Patient refusing to allow C-collar to be applied and has removed the collar twice. Encouraged patient to remain on back, attempt to keep her head straight, and avoid sudden movements of head. MD Jodi MourningZavitz informed of patient decisions and this RNs interventions. Also informed MD of patient's continued requests for pain medication.

## 2015-05-24 LAB — BASIC METABOLIC PANEL
Anion gap: 10 (ref 5–15)
BUN: 6 mg/dL (ref 6–20)
CALCIUM: 8.5 mg/dL — AB (ref 8.9–10.3)
CO2: 26 mmol/L (ref 22–32)
Chloride: 107 mmol/L (ref 101–111)
Creatinine, Ser: 0.86 mg/dL (ref 0.44–1.00)
GFR calc Af Amer: >60 mL/min (ref >60)
GLUCOSE: 126 mg/dL — AB (ref 65–99)
Potassium: 4.4 mmol/L (ref 3.5–5.1)
Sodium: 143 mmol/L (ref 135–145)

## 2015-05-24 LAB — CBC
HEMATOCRIT: 40.5 % (ref 36.0–46.0)
Hemoglobin: 13.1 g/dL (ref 12.0–15.0)
MCH: 29.2 pg (ref 26.0–34.0)
MCHC: 32.3 g/dL (ref 30.0–36.0)
MCV: 90.4 fL (ref 78.0–100.0)
PLATELETS: 304 10*3/uL (ref 150–400)
RBC: 4.48 MIL/uL (ref 3.87–5.11)
RDW: 14.7 % (ref 11.5–15.5)
WBC: 10.4 10*3/uL (ref 4.0–10.5)

## 2015-05-24 MED ORDER — OXYCODONE HCL 5 MG PO TABS: 5.0000 mg | ORAL_TABLET | ORAL | Status: DC | PRN

## 2015-05-24 MED ORDER — KETOROLAC TROMETHAMINE 30 MG/ML IJ SOLN
30.0000 mg | Freq: Four times a day (QID) | INTRAMUSCULAR | Status: DC | PRN
  Administered 2015-05-24 – 2015-05-27 (×4): 30 mg via INTRAVENOUS
  Filled 2015-05-24 (×4): qty 1
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
## 2015-05-24 NOTE — Progress Notes (Signed)
Orthopedic Tech Progress Note Patient Details:  Deeann Creengela B Ferris 01/11/1961 [id number]  Patient ID: Deeann CreeAngela B Ferris, female   DOB: 01/11/1961, 55 y.o.   MRN: [id number] Called in bio-tech brace order; spoke with Daisy Floroandy  Arseniy Toomey 05/24/2015, 10:28 AM
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
## 2015-05-25 MED ORDER — POLYETHYLENE GLYCOL 3350 17 G PO PACK
17.0000 g | PACK | Freq: Every day | ORAL | Status: DC
  Administered 2015-05-25 – 2015-05-28 (×4): 17 g via ORAL
  Filled 2015-05-25 (×4): qty 1

## 2015-05-25 MED ORDER — SODIUM CHLORIDE 0.9 % IV SOLN
1000.0000 mL | INTRAVENOUS | Status: DC
  Administered 2015-05-25 (×2): 1000 mL via INTRAVENOUS

## 2015-05-25 MED ORDER — TRAMADOL HCL 50 MG PO TABS
50.0000 mg | ORAL_TABLET | Freq: Four times a day (QID) | ORAL | Status: DC
  Administered 2015-05-25 (×3): 50 mg via ORAL
  Filled 2015-05-25 (×3): qty 1
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
## 2015-05-25 NOTE — Progress Notes (Signed)
Utilization review completed. Shuaib Corsino, RN, BSN. 
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
## 2015-05-26 MED ORDER — HYDROMORPHONE HCL 1 MG/ML IJ SOLN
1.0000 mg | INTRAMUSCULAR | Status: DC | PRN
  Administered 2015-05-26 (×3): 1 mg via INTRAVENOUS
  Filled 2015-05-26 (×3): qty 1

## 2015-05-26 MED ORDER — OXYCODONE HCL 5 MG PO TABS
5.0000 mg | ORAL_TABLET | ORAL | Status: DC | PRN
  Administered 2015-05-26 – 2015-05-28 (×8): 15 mg via ORAL
  Administered 2015-05-28: 10 mg via ORAL
  Administered 2015-05-28: 15 mg via ORAL
  Filled 2015-05-26 (×6): qty 3
  Filled 2015-05-26: qty 2
  Filled 2015-05-26 (×3): qty 3
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
## 2015-05-26 NOTE — Progress Notes (Signed)
Patient ID: Lindsay Joyce, female   DOB: 10-Sep-1960, 55 y.o.   MRN: [id number] Subjective:  The patient is alert and pleasant. She continues to complain of back pain. She is not mobilizing much.  Objective: Vital signs in last 24 hours: Temp:  [97.8 F (36.6 C)-98 F (36.7 C)] 97.8 F (36.6 C) (03/20 2113) Pulse Rate:  [82-88] 82 (03/20 2113) Resp:  [16] 16 (03/20 2113) BP: (125-140)/(62-82) 140/62 mmHg (03/20 2113) SpO2:  [91 %] 91 % (03/20 2113)  Intake/Output from previous day: 03/20 0701 - 03/21 0700 In: 60 [IV Piggyback:60] Out: -  Intake/Output this shift: Total I/O In: -  Out: 1 [Urine:1]  Physical exam the patient is alert and pleasant. She is moving her lower extremity well.  Lab Results:  Recent Labs  05/23/15 1800 05/24/15 0500  WBC 17.8* 10.4  HGB 14.6 13.1  HCT 45.3 40.5  PLT 339 304   BMET  Recent Labs  05/23/15 1800 05/24/15 0500  NA 141 143  K 5.1 4.4  CL 109 107  CO2 22 26  GLUCOSE 112* 126*  BUN 6 6  CREATININE 1.03* 0.86  CALCIUM 8.8* 8.5*    Studies/Results: No results found.  Assessment/Plan: L1 fracture: I encouraged the patient to mobilize. Hopefully we can avoid surgery.  LOS: 3 days     Ricky Doan D 05/26/2015, 7:49 AM
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
## 2015-05-27 MED ORDER — DOCUSATE SODIUM 100 MG PO CAPS
100.0000 mg | ORAL_CAPSULE | Freq: Two times a day (BID) | ORAL | Status: DC
  Administered 2015-05-27 – 2015-05-28 (×3): 100 mg via ORAL
  Filled 2015-05-27 (×3): qty 1

## 2015-05-27 MED ORDER — MAGNESIUM HYDROXIDE 400 MG/5ML PO SUSP
30.0000 mL | Freq: Every day | ORAL | Status: DC
  Administered 2015-05-27 – 2015-05-28 (×2): 30 mL via ORAL
  Filled 2015-05-27 (×2): qty 30

## 2015-05-27 MED ORDER — HYDROMORPHONE HCL 1 MG/ML IJ SOLN: 1.0000 mg | Freq: Three times a day (TID) | INTRAMUSCULAR | Status: DC | PRN

## 2015-05-27 NOTE — Clinical Social Work Note (Signed)
Clinical Social Work Assessment  Patient Details  Name: Lindsay Joyce MRN: [id number] Date of Birth: 1961-01-29  Date of referral:  05/27/15               Reason for consult:  Discharge Planning, Facility Placement                Permission sought to share information with:  Family Supports, Chartered certified accountant granted to share information::  Yes, Verbal Permission Granted  Name::     Arboriculturist::     Relationship::     Contact Information:     Housing/Transportation Living arrangements for the past 2 months:  Single Family Home Source of Information:  Patient Patient Interpreter Needed:  None Criminal Activity/Legal Involvement Pertinent to Current Situation/Hospitalization:  No - Comment as needed Significant Relationships:  None Lives with:  Spouse Do you feel safe going back to the place where you live?  Yes Need for family participation in patient care:  Yes (Comment)  Care giving concerns:  The patient states she does not have any concerns about returning home as she will have the support she needs in the home.   Social Worker assessment / plan:  CSW met with patient and husband at bedside to complete assessment. The patient reports that her husband and mother in law are her main supports. Per husband, there will also be a couple of friends helping with providing 24 hr supervision at discharge. The patient denies any flashbacks or nightmares at this time and does not endorse any other symptoms of acute stress response. The patient's husband states that his mom work for [employer] and that this would be the company the patient would go with for [facility] services. RNCM made aware. CSW signing off at this time as the patient does not present with any CSW related needs at this time.  Employment status:    Insurance information:  Other (Comment Required) (Med pay) PT Recommendations:  Home with Home Health, 24 Hour Supervision Information / Referral to community  resources:     Patient/Family's Response to care:  The patient is appreciative of the care she has received but shares that she is still experiencing patient.  Patient/Family's Understanding of and Emotional Response to Diagnosis, Current Treatment, and Prognosis:  The patient appears to be coping well but is wanting to return home soon. She understands her reason for admission and her post DC needs.   Emotional Assessment Appearance:    Attitude/Demeanor/Rapport:  Other (Patient appears to be in pain. She is welcoming of CSW and engaged in assessment.) Affect (typically observed):  Accepting, Appropriate, Calm Orientation:  Oriented to Self, Oriented to Place, Oriented to  Time, Oriented to Situation Alcohol / Substance use:  Illicit Drugs (Per chart patient uses marijuana. Patient denies ETOH use. SBIRT completed.) Psych involvement (Current and /or in the community):  No (Comment)  Discharge Needs  Concerns to be addressed:  Discharge Planning Concerns Readmission within the last 30 days:  No Current discharge risk:  None Barriers to Discharge:  Continued Medical Work up   Rigoberto Noel, LCSW 05/27/2015, 11:23 AM

## 2015-05-27 NOTE — Progress Notes (Signed)
PT Cancellation Note  Patient Details Name: Lindsay Joyce MRN: [id number] DOB: [birth year]   Cancelled Treatment:    Reason Eval/Treat Not Completed: Patient declined, no reason specified Pt declined any mobility due to pain in back, bilat LE, and headache. Encouraged pt to ambulate with nursing staff when feeling better. PT will check on pt later as time allows.    Derek MoundKellyn R Omer Puccinelli Jen Eppinger, PTA Pager: [phone]   05/27/2015, 2:46 PM
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
## 2015-05-28 DIAGNOSIS — S1093XA Contusion of unspecified part of neck, initial encounter: Secondary | ICD-10-CM | POA: Diagnosis present

## 2015-05-28 DIAGNOSIS — K567 Ileus, unspecified: Secondary | ICD-10-CM | POA: Diagnosis present

## 2015-05-28 MED ORDER — LIDOCAINE 5 % EX PTCH: 1.0000 | MEDICATED_PATCH | CUTANEOUS | Status: DC

## 2015-05-28 MED ORDER — LIDOCAINE 5 % EX PTCH
1.0000 | MEDICATED_PATCH | CUTANEOUS | Status: DC
  Administered 2015-05-28: 1 via TRANSDERMAL
  Filled 2015-05-28: qty 1

## 2015-05-28 MED ORDER — NAPROXEN 500 MG PO TABS: 500.0000 mg | ORAL_TABLET | Freq: Two times a day (BID) | ORAL | Status: DC

## 2015-05-28 MED ORDER — METHOCARBAMOL 500 MG PO TABS: 1000.0000 mg | ORAL_TABLET | Freq: Three times a day (TID) | ORAL | Status: DC | PRN

## 2015-05-28 MED ORDER — BISACODYL 10 MG RE SUPP: 10.0000 mg | Freq: Every day | RECTAL | Status: DC | PRN

## 2015-05-28 MED ORDER — POLYETHYLENE GLYCOL 3350 17 G PO PACK: 17.0000 g | PACK | Freq: Every day | ORAL | Status: DC | PRN

## 2015-05-28 MED ORDER — NAPROXEN 250 MG PO TABS
500.0000 mg | ORAL_TABLET | Freq: Two times a day (BID) | ORAL | Status: DC
  Administered 2015-05-28: 500 mg via ORAL
  Filled 2015-05-28: qty 2

## 2015-05-28 MED ORDER — OXYCODONE HCL 5 MG PO TABS
5.0000 mg | ORAL_TABLET | Freq: Four times a day (QID) | ORAL | Status: DC | PRN
Start: 2015-05-28 — End: 2015-05-28

## 2015-05-28 MED ORDER — OXYCODONE HCL 5 MG PO TABS: 5.0000 mg | ORAL_TABLET | Freq: Four times a day (QID) | ORAL | Status: DC | PRN

## 2015-05-28 MED ORDER — DOCUSATE SODIUM 100 MG PO CAPS: 100.0000 mg | ORAL_CAPSULE | Freq: Two times a day (BID) | ORAL | Status: DC

## 2015-05-28 MED ORDER — MAGNESIUM HYDROXIDE 400 MG/5ML PO SUSP: 30.0000 mL | Freq: Every day | ORAL | Status: DC | PRN

## 2015-05-28 MED ORDER — BISACODYL 10 MG RE SUPP: 10.0000 mg | Freq: Every day | RECTAL | Status: DC

## 2015-05-28 NOTE — Progress Notes (Signed)
05/28/15  1438  Reviewed discharge instructions with patient. Pt verbalized understanding of discharge orders. Copy of discharge instructions and prescriptions given to patient.
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
## 2015-05-28 NOTE — Progress Notes (Signed)
05/28/15  1436  PT came to work with patient. Patient refused to work with PT prior to discharge.
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
## 2015-05-28 NOTE — Progress Notes (Signed)
PT Cancellation Note  Patient Details Name: Lindsay Joyce MRN: [id number] DOB: 05-Nov-1960   Cancelled Treatment:    Reason Eval/Treat Not Completed: Patient declined, no reason specifiedPt declined and reported that she just wants to go home. RN notified.   Derek MoundKellyn R Sevastian Witczak Laprecious Austill, PTA Pager: [phone]   05/28/2015, 2:39 PM

## 2015-05-28 NOTE — Discharge Instructions (Signed)
Thoracolumbosacral Orthosis Brace A thoracolumbosacral orthosis (TLSO) brace can be worn for different purposes. A TLSO brace can be worn to support the back. Your back may need more support if you had a broken bone in your back (fractured vertebrae), back surgery, or you cannot move (paralysis) your torso muscles. A TLSO brace can also be worn by a child to help prevent curving back problems from getting worse as the child grows. TLSO braces are used to limit bending and twisting. The braces are made from a hard plastic. They are custom fit for each wearer. The TLSO brace covers both the front and back of the torso. On the back, the brace reaches from just above the tailbone to just below the shoulder blades. On the front, the brace covers the ribs and often reaches past the waist and over the hips. The brace can be worn under clothing.  HOME CARE INSTRUCTIONS  Ask your caregiver if you can remove your brace when showering or sleeping.  Follow your caregiver's instructions for putting on your brace.  Follow your caregiver's instructions about how much weight you can lift.  Always wear a T-shirt under the brace.  Make sure the brace is always well-aligned and fits you closely.  Check your skin daily for sores and redness caused by rubbing or pressure.  If you feel unsteady, use a cane or walker for support until you feel more steady.  Sit in high, firm chairs. It may be difficult to stand up from low, soft chairs.  Keep all follow-up appointments as directed by your caregiver. SEEK IMMEDIATE MEDICAL CARE IF:  You have a fever.  You have shortness of breath.  You have chest pain.  You have numbness, tingling, or pain. Developed in conjunction with the [employer] at [facility].   This information is not intended to replace advice given to you by your health care provider. Make sure you discuss any questions you have with your health care  provider.   Document Released: 02/10/2011 Document Revised: 05/16/2011 Document Reviewed: 02/10/2011 Elsevier Interactive Patient Education 2016 Elsevier Inc.   Lumbar Fracture A lumbar fracture is a break in one of the bones of the lower back. Lumbar fractures range in severity. Severe fractures can damage the spinal cord. CAUSES This condition may be caused by:  A fall (common).  A car accident (common).  A gunshot wound.  A hard, direct hit to the back.  Osteoporosis. SYMPTOMS The main symptom of this condition is severe pain in the lower back. If a fracture is complex or severe, there may also be:  A misshapen or swollen area on the lower back.  A limited ability to move an area of the lower back.  An inability to empty the bladder or bowel.  A loss of strength or sensation in the legs, feet, and toes.  Paralysis. DIAGNOSIS This condition is diagnosed based on:  A physical exam.  Symptoms and what happened just before they developed.  The results of imaging tests, such as an X-ray, CT scan, or MRI. If your nerves have been damaged, you may also have other tests to find out how much damage there is. TREATMENT Treatment for this condition depends on the specifics of the injury. Most fractures can be treated with:  A back brace.  Bed rest and activity restrictions.  Pain medicine.  Physical therapy. Fractures that are complex, involve multiple bones, or make the spine unstable may require surgery to remove pressure  from the nerves or spinal cord and to stabilize the broken pieces of bone. During recovery, it is normal to have pain and stiffness in the back for weeks. HOME CARE INSTRUCTIONS Medicines  Take medicines only as directed by your health care provider.  Do not drive or operate heavy machinery while taking pain medicine. Activity  Stay in bed for as long as directed by your health care provider.  If you were shown how to do any exercises to  improve motion and strength in your back, do them as directed by your health care provider.  Return to your normal activities as directed by your health care provider. Ask your health care provider what activities are safe for you. General Instructions  If you were given a neck brace or back brace, wear it as directed by your health care provider.  Keep all follow-up visits as directed by your health care provider. This is important. Failure to follow-up as recommended could result in permanent injury, disability, and long-lasting (chronic) pain. SEEK MEDICAL CARE IF:  Your pain does not improve over time.  You have a persistent cough.  You cannot return to your normal activities as planned or expected. SEEK IMMEDIATE MEDICAL CARE IF:  You have severe pain or your pain suddenly gets worse.  You are unable to move.  You have numbness, tingling, weakness, or paralysis in any part of your body.  You cannot control your bladder or bowel.  You have difficulty breathing.  You have a fever.  You have pain in your chest or abdomen.  You vomit.   This information is not intended to replace advice given to you by your health care provider. Make sure you discuss any questions you have with your health care provider.   Document Released: 06/08/2006 Document Revised: 07/08/2014 Document Reviewed: 02/17/2014 Elsevier Interactive Patient Education 2016 Elsevier Inc.  Soft Tissue Injury of the Neck A soft tissue injury of the neck may be either blunt or penetrating. A blunt injury does not break the skin. A penetrating injury breaks the skin, creating an open wound. Blunt injuries may happen in several ways. Most involve some type of direct blow to the neck. This can cause serious injury to the windpipe, voice box, cervical spine, or esophagus. In some cases, the injury to the soft tissue can also result in a break (fracture) of the cervical spine.  Soft tissue injuries of the neck require  immediate medical care. Sometimes, you may not notice the signs of injury right away. You may feel fine at first, but the swelling may eventually close off your airway. This could result in a significant or life-threatening injury. This is rare, but it is important to keep in mind with any injury to the neck.  CAUSES  Causes of blunt injury may include:  "Clothesline" injuries. This happens when someone is moving at high speed and runs into a clothesline, outstretched arm, or similar object. This results in a direct injury to the front of the neck. If the airway is blocked, it can cause suffocation due to lack of oxygen (asphyxiation) or even instant death.  High-energy trauma. This includes injuries from motor vehicle crashes, falling from a great height, or heavy objects falling onto the neck.  Sports-related injuries. Injury to the windpipe and voice box can result from being struck by another player or being struck by an object, such as a baseball, hockey stick, or an outstretched arm.  Strangulation. This type of injury may cause skin  trauma, hoarseness of voice, or broken cartilage in the voice box or windpipe. It may also cause a serious airway problem. SYMPTOMS   Bruising.  Pain and tenderness in the neck.  Swelling of the neck and face.  Hoarseness of voice.  Pain or difficulty with swallowing.  Drooling or inability to swallow.  Trouble breathing. This may become worse when lying flat.  Coughing up blood.  High-pitched, harsh, vibratory noise due to partial obstruction of the windpipe (stridor).  Swelling of the upper arms.  Windpipe that appears to be pushed off to one side.  Air in the tissues under the skin of the neck or chest (subcutaneous emphysema). This usually indicates a problem with the normal airway and is a medical emergency. DIAGNOSIS   If possible, your caregiver may ask about the details of how the injury occurred. A detailed exam can help to identify  specific areas of the neck that are injured.  Your caregiver may ask for tests to rule out injury of the voice box, airway, or esophagus. This may include X-rays, ultrasounds, CT scans, or MRI scans, depending on the severity of your injury. TREATMENT  If you have an injury to your windpipe or voice box, immediate medical care is required. In almost all cases, hospitalization is necessary. For injuries that do not appear to require surgery, it is helpful to have medical observation for 24 hours. You may be asked to do one or more of the following:  Rest your voice.  Bed rest.  Limit your diet, depending on the extent of the injury. Follow your caregiver's dietary guidelines. Often, only fluids and soft foods are recommended.  Keep your head raised.  Breathe humidified air.  Take medicines to control infection, reduce swelling, and reduce normal stomach acid. You may also need pain medicine, depending on your injury. For injuries that appear to require surgery, you will need to stay in the hospital. The exact type of procedure needed will depend on your exact injury or injuries.  HOME CARE INSTRUCTIONS   If the skin was broken, keep the wound area clean and dry. Wear your bandage (dressing) and care for your wound as instructed.  Follow your caregiver's advice about your diet.  Follow your caregiver's advice about use of your voice.  Take medicines as directed.  Keep your head and neck at least partially raised (elevated) while recovering. This should also be done while sleeping. SEEK MEDICAL CARE IF:   Your voice becomes weaker.  Your swelling or bruising is not improving as expected. Typically, this takes several days to improve.  You feel that you are having problems with medicines prescribed.  You have drainage from the injury site. This may be a sign that your wound is not healing properly or is infected.  You develop increasing pain or difficulty while swallowing.  You  develop an oral temperature of 102 F (38.9 C) or higher. SEEK IMMEDIATE MEDICAL CARE IF:   You cough up blood.  You develop sudden trouble breathing.  You cannot tolerate your oral medicines, or you are unable to swallow.  You develop drooling.  You have new or worsening vomiting.  You develop sudden, new swelling of the neck or face.  You have an oral temperature above 102 F (38.9 C), not controlled by medicine. MAKE SURE YOU:  Understand these instructions.  Will watch your condition.  Will get help right away if you are not doing well or get worse.   This information is not  intended to replace advice given to you by your health care provider. Make sure you discuss any questions you have with your health care provider.   Document Released: 05/31/2007 Document Revised: 05/16/2011 Document Reviewed: 09/10/2014 Elsevier Interactive Patient Education 2016 [employer]  Constipation, Adult Constipation is when a person has fewer than three bowel movements a week, has difficulty having a bowel movement, or has stools that are dry, hard, or larger than normal. As people grow older, constipation is more common. A low-fiber diet, not taking in enough fluids, and taking certain medicines may make constipation worse.  CAUSES   Certain medicines, such as antidepressants, pain medicine, iron supplements, antacids, and water pills.   Certain diseases, such as diabetes, irritable bowel syndrome (IBS), thyroid disease, or depression.   Not drinking enough water.   Not eating enough fiber-rich foods.   Stress or travel.   Lack of physical activity or exercise.   Ignoring the urge to have a bowel movement.   Using laxatives too much.  SIGNS AND SYMPTOMS   Having fewer than three bowel movements a week.   Straining to have a bowel movement.   Having stools that are hard, dry, or larger than normal.   Feeling full or bloated.   Pain in the lower abdomen.    Not feeling relief after having a bowel movement.  DIAGNOSIS  Your health care provider will take a medical history and perform a physical exam. Further testing may be done for severe constipation. Some tests may include:  A barium enema X-ray to examine your rectum, colon, and, sometimes, your small intestine.   A sigmoidoscopy to examine your lower colon.   A colonoscopy to examine your entire colon. TREATMENT  Treatment will depend on the severity of your constipation and what is causing it. Some dietary treatments include drinking more fluids and eating more fiber-rich foods. Lifestyle treatments may include regular exercise. If these diet and lifestyle recommendations do not help, your health care provider may recommend taking over-the-counter laxative medicines to help you have bowel movements. Prescription medicines may be prescribed if over-the-counter medicines do not work.  HOME CARE INSTRUCTIONS   Eat foods that have a lot of fiber, such as fruits, vegetables, whole grains, and beans.  Limit foods high in fat and processed sugars, such as french fries, hamburgers, cookies, candies, and soda.   A fiber supplement may be added to your diet if you cannot get enough fiber from foods.   Drink enough fluids to keep your urine clear or pale yellow.   Exercise regularly or as directed by your health care provider.   Go to the restroom when you have the urge to go. Do not hold it.   Only take over-the-counter or prescription medicines as directed by your health care provider. Do not take other medicines for constipation without talking to your health care provider first.  SEEK IMMEDIATE MEDICAL CARE IF:   You have bright red blood in your stool.   Your constipation lasts for more than 4 days or gets worse.   You have abdominal or rectal pain.   You have thin, pencil-like stools.   You have unexplained weight loss. MAKE SURE YOU:   Understand these  instructions.  Will watch your condition.  Will get help right away if you are not doing well or get worse.   This information is not intended to replace advice given to you by your health care provider. Make sure you discuss any questions you  have with your health care provider.   Document Released: 11/20/2003 Document Revised: 03/14/2014 Document Reviewed: 12/03/2012 Elsevier Interactive Patient Education [employer].

## 2015-05-28 NOTE — Progress Notes (Signed)
Patient ID: Lindsay Joyce, female   DOB: 29-Mar-1960, 55 y.o.   MRN: [id number] Subjective:  Patient is alert and pleasant. She just got back from the cafe with her sister. She continues to complain of back pain.  Objective: Vital signs in last 24 hours: Temp:  [98.6 F (37 C)-98.7 F (37.1 C)] 98.7 F (37.1 C) (03/23 0510) Pulse Rate:  [74-88] 88 (03/23 0510) Resp:  [14-18] 18 (03/23 0510) BP: (120-148)/(62-86) 148/86 mmHg (03/23 0510) SpO2:  [90 %-92 %] 90 % (03/23 0510)  Intake/Output from previous day: 03/22 0701 - 03/23 0700 In: 420 [P.O.:420] Out: -  Intake/Output this shift:    Physical exam patient is alert and pleasant. Her strength is grossly normal and lower extremities.  Lab Results: No results for input(s): WBC, HGB, HCT, PLT in the last 72 hours. BMET No results for input(s): NA, K, CL, CO2, GLUCOSE, BUN, CREATININE, CALCIUM in the last 72 hours.  Studies/Results: No results found.  Assessment/Plan: L1 fracture: I have again discussed the situation with the patient. Our choices are to continue the TLSO and give it time to heal versus surgery. The patient is not interested in surgery.  LOS: 5 days     [name] D 05/28/2015, 10:38 AM

## 2015-07-25 ENCOUNTER — Encounter (HOSPITAL_COMMUNITY): Payer: Self-pay | Admitting: Emergency Medicine

## 2015-07-25 ENCOUNTER — Emergency Department (HOSPITAL_COMMUNITY): Payer: No Typology Code available for payment source

## 2015-07-25 ENCOUNTER — Emergency Department (HOSPITAL_COMMUNITY)
Admission: EM | Admit: 2015-07-25 | Discharge: 2015-07-26 | Disposition: A | Discharge status: Home / Self Care | Payer: No Typology Code available for payment source | Attending: Emergency Medicine | Admitting: Emergency Medicine

## 2015-07-25 DIAGNOSIS — Z7982 Long term (current) use of aspirin: Secondary | ICD-10-CM | POA: Insufficient documentation

## 2015-07-25 DIAGNOSIS — F329 Major depressive disorder, single episode, unspecified: Secondary | ICD-10-CM | POA: Diagnosis not present

## 2015-07-25 DIAGNOSIS — Z87448 Personal history of other diseases of urinary system: Secondary | ICD-10-CM | POA: Diagnosis not present

## 2015-07-25 DIAGNOSIS — T148XXA Other injury of unspecified body region, initial encounter: Secondary | ICD-10-CM

## 2015-07-25 DIAGNOSIS — Z79899 Other long term (current) drug therapy: Secondary | ICD-10-CM | POA: Diagnosis not present

## 2015-07-25 DIAGNOSIS — S32019D Unspecified fracture of first lumbar vertebra, subsequent encounter for fracture with routine healing: Secondary | ICD-10-CM | POA: Diagnosis not present

## 2015-07-25 DIAGNOSIS — F17210 Nicotine dependence, cigarettes, uncomplicated: Secondary | ICD-10-CM | POA: Diagnosis not present

## 2015-07-25 DIAGNOSIS — S3992XD Unspecified injury of lower back, subsequent encounter: Secondary | ICD-10-CM | POA: Diagnosis present

## 2015-07-25 DIAGNOSIS — Z8673 Personal history of transient ischemic attack (TIA), and cerebral infarction without residual deficits: Secondary | ICD-10-CM | POA: Diagnosis not present

## 2015-07-25 DIAGNOSIS — F419 Anxiety disorder, unspecified: Secondary | ICD-10-CM | POA: Diagnosis not present
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
## 2015-07-26 NOTE — Discharge Instructions (Signed)
Please call and follow up closely with Dr. Arnoldo Morale for further management of your back discomfort.  Return to the ER if you have any concerns.   Spinal Compression Fracture A spinal compression fracture is a collapse of the bones that form the spine (vertebrae). With this type of fracture, the vertebrae become squashed (compressed) into a wedge shape. Most compression fractures happen in the middle or lower part of the spine. CAUSES This condition may be caused by:  Thinning and loss of density in the bones (osteoporosis). This is the most common cause.  A fall.  A car or motorcycle accident.  Cancer.  Trauma, such as a heavy, direct hit to the head. RISK FACTORS You may be at greater risk for a spinal compression fracture if you:  Are 77 years old or older.  Have osteoporosis.  Have certain types of cancer, including:  Multiple myeloma.  Lymphoma.  Prostate cancer.  Lung cancer.  Breast cancer. SYMPTOMS Symptoms of this condition include:  Severe pain.  Pain that gets worse over time.  Pain that is worse when you stand, walk, sit, or bend.  Sudden pain that is so bad that it is hard for you to move.  Bending or humping of the spine.  Gradual loss of height.  Numbness, tingling, or weakness in the back and legs.  Trouble walking. Your symptoms will depend on the cause of the fracture and how quickly it develops. For example, fractures that are caused by osteoporosis can cause few symptoms, no symptoms, or symptoms that develop slowly over time. DIAGNOSIS This condition may be diagnosed based on symptoms, medical history, and a physical exam. During the physical exam, your health care provider may tap along the length of your spine to check for tenderness. Tests may be done to confirm the diagnosis. They may include:  A bone density test to check for osteoporosis.  Imaging tests, such as a spine X-ray, a CT scan, or MRI. TREATMENT Treatment for this  condition depends on the cause and severity of the condition.Some fractures, such as those that are caused by osteoporosis, may heal on their own with supportive care. This may include:  Pain medicine.  Rest.  A back brace.  Physical therapy exercises.  Medicine that reduces bone pain.  Calcium and vitamin D supplements. Fractures that cause the back to become misshapen, cause nerve pain or weakness, or do not respond to other treatment may be treated with a surgical procedure, such as:  Vertebroplasty. In this procedure, bone cement is injected into the collapsed vertebrae to stabilize them.  Balloon kyphoplasty. In this procedure, the collapsed vertebrae are expanded with a balloon and then bone cement is injected into them.  Spinal fusion. In this procedure, the collapsed vertebrae are connected (fused) to normal vertebrae. HOME CARE INSTRUCTIONS General Instructions  Take medicines only as directed by your health care provider.  Do not drive or operate heavy machinery while taking pain medicine.  If directed, apply ice to the injured area:  Put ice in a plastic bag.  Place a towel between your skin and the bag.  Leave the ice on for 30 minutes every two hours at first. Then apply the ice as needed.  Wear your neck brace or back brace as directed by your health care provider.  Do not drink alcohol. Alcohol can interfere with your treatment.  Keep all follow-up visits as directed by your health care provider. This is important. It can help to prevent permanent injury, disability,  and long-lasting (chronic) pain. Activity  Stay in bed (on bed rest) only as directed by your health care provider. Being on bed rest for too long can make your condition worse.  Return to your normal activities as directed by your health care provider. Ask what activities are safe for you.  Do exercises to improve motion and strength in your back (physical therapy), as recommended by your  health care provider.  Exercise regularly as directed by your health care provider. SEEK MEDICAL CARE IF:  You have a fever.  You develop a cough that makes your pain worse.  Your pain medicine is not helping.  Your pain does not get better over time.  You cannot return to your normal activities as planned or expected. SEEK IMMEDIATE MEDICAL CARE IF:  Your pain is very bad and it suddenly gets worse.  You are unable to move any body part (paralysis) that is below the level of your injury.  You have numbness, tingling, or weakness in any body part that is below the level of your injury.  You cannot control your bladder or bowels.   This information is not intended to replace advice given to you by your health care provider. Make sure you discuss any questions you have with your health care provider.   Document Released: 02/21/2005 Document Revised: 07/08/2014 Document Reviewed: 02/25/2014 Elsevier Interactive Patient Education [employer].

## 2015-07-26 NOTE — ED Notes (Signed)
EDP at bedside  

## 2017-05-24 ENCOUNTER — Other Ambulatory Visit: Payer: Self-pay | Admitting: Family Medicine

## 2017-05-24 DIAGNOSIS — Z1231 Encounter for screening mammogram for malignant neoplasm of breast: Secondary | ICD-10-CM

## 2019-08-22 ENCOUNTER — Emergency Department (HOSPITAL_COMMUNITY): Payer: Self-pay

## 2019-08-22 ENCOUNTER — Encounter (HOSPITAL_COMMUNITY): Payer: Self-pay

## 2019-08-22 ENCOUNTER — Inpatient Hospital Stay (HOSPITAL_COMMUNITY)
Admission: RE | Admit: 2019-08-22 | Discharge: 2019-08-25 | DRG: 574 | Disposition: A | Discharge status: Home / Self Care | Payer: Self-pay | Attending: Internal Medicine | Admitting: Internal Medicine

## 2019-08-22 ENCOUNTER — Other Ambulatory Visit: Payer: Self-pay

## 2019-08-22 DIAGNOSIS — L02512 Cutaneous abscess of left hand: Secondary | ICD-10-CM | POA: Diagnosis present

## 2019-08-22 DIAGNOSIS — W540XXA Bitten by dog, initial encounter: Secondary | ICD-10-CM | POA: Diagnosis present

## 2019-08-22 DIAGNOSIS — Z20822 Contact with and (suspected) exposure to covid-19: Secondary | ICD-10-CM | POA: Diagnosis present

## 2019-08-22 DIAGNOSIS — E876 Hypokalemia: Secondary | ICD-10-CM | POA: Diagnosis present

## 2019-08-22 DIAGNOSIS — B9620 Unspecified Escherichia coli [E. coli] as the cause of diseases classified elsewhere: Secondary | ICD-10-CM | POA: Diagnosis present

## 2019-08-22 DIAGNOSIS — Z8673 Personal history of transient ischemic attack (TIA), and cerebral infarction without residual deficits: Secondary | ICD-10-CM

## 2019-08-22 DIAGNOSIS — S61452A Open bite of left hand, initial encounter: Secondary | ICD-10-CM | POA: Diagnosis present

## 2019-08-22 DIAGNOSIS — G8929 Other chronic pain: Secondary | ICD-10-CM | POA: Diagnosis present

## 2019-08-22 DIAGNOSIS — F418 Other specified anxiety disorders: Secondary | ICD-10-CM | POA: Diagnosis present

## 2019-08-22 DIAGNOSIS — F17200 Nicotine dependence, unspecified, uncomplicated: Secondary | ICD-10-CM | POA: Diagnosis present

## 2019-08-22 DIAGNOSIS — K219 Gastro-esophageal reflux disease without esophagitis: Secondary | ICD-10-CM | POA: Diagnosis present

## 2019-08-22 DIAGNOSIS — L03114 Cellulitis of left upper limb: Principal | ICD-10-CM | POA: Diagnosis present

## 2019-08-22 DIAGNOSIS — N3000 Acute cystitis without hematuria: Secondary | ICD-10-CM

## 2019-08-22 DIAGNOSIS — Z79899 Other long term (current) drug therapy: Secondary | ICD-10-CM

## 2019-08-22 DIAGNOSIS — F1220 Cannabis dependence, uncomplicated: Secondary | ICD-10-CM | POA: Diagnosis present

## 2019-08-22 DIAGNOSIS — Z7982 Long term (current) use of aspirin: Secondary | ICD-10-CM

## 2019-08-22 DIAGNOSIS — F17210 Nicotine dependence, cigarettes, uncomplicated: Secondary | ICD-10-CM | POA: Diagnosis present

## 2019-08-22 DIAGNOSIS — Z9049 Acquired absence of other specified parts of digestive tract: Secondary | ICD-10-CM

## 2019-08-22 DIAGNOSIS — Z23 Encounter for immunization: Secondary | ICD-10-CM

## 2019-08-22 LAB — COMPREHENSIVE METABOLIC PANEL
ALT: 10 U/L (ref 0–44)
AST: 15 U/L (ref 15–41)
Albumin: 3.8 g/dL (ref 3.5–5.0)
Alkaline Phosphatase: 29 U/L — ABNORMAL LOW (ref 38–126)
Anion gap: 10 (ref 5–15)
BUN: 6 mg/dL (ref 6–20)
CO2: 25 mmol/L (ref 22–32)
Calcium: 8.4 mg/dL — ABNORMAL LOW (ref 8.9–10.3)
Chloride: 102 mmol/L (ref 98–111)
Creatinine, Ser: 1.04 mg/dL — ABNORMAL HIGH (ref 0.44–1.00)
GFR calc Af Amer: >60 mL/min (ref >60)
GFR calc non Af Amer: >60 mL/min (ref >60)
Glucose, Bld: 96 mg/dL (ref 70–99)
Potassium: 3.8 mmol/L (ref 3.5–5.1)
Sodium: 137 mmol/L (ref 135–145)
Total Bilirubin: 0.3 mg/dL (ref 0.3–1.2)
Total Protein: 6.8 g/dL (ref 6.5–8.1)

## 2019-08-22 LAB — CBC WITH DIFFERENTIAL/PLATELET
Abs Immature Granulocytes: 0.02 10*3/uL (ref 0.00–0.07)
Basophils Absolute: 0.1 10*3/uL (ref 0.0–0.1)
Basophils Relative: 1 %
Eosinophils Absolute: 0.2 10*3/uL (ref 0.0–0.5)
Eosinophils Relative: 2 %
HCT: 45.5 % (ref 36.0–46.0)
Hemoglobin: 15 g/dL (ref 12.0–15.0)
Immature Granulocytes: 0 %
Lymphocytes Relative: 38 %
Lymphs Abs: 3.7 10*3/uL (ref 0.7–4.0)
MCH: 29.9 pg (ref 26.0–34.0)
MCHC: 33 g/dL (ref 30.0–36.0)
MCV: 90.6 fL (ref 80.0–100.0)
Monocytes Absolute: 0.5 10*3/uL (ref 0.1–1.0)
Monocytes Relative: 5 %
Neutro Abs: 5.3 10*3/uL (ref 1.7–7.7)
Neutrophils Relative %: 54 %
Platelets: 374 10*3/uL (ref 150–400)
RBC: 5.02 MIL/uL (ref 3.87–5.11)
RDW: 13.4 % (ref 11.5–15.5)
WBC: 9.8 10*3/uL (ref 4.0–10.5)
nRBC: 0 % (ref 0.0–0.2)

## 2019-08-22 LAB — URINALYSIS, ROUTINE W REFLEX MICROSCOPIC
Bilirubin Urine: NEGATIVE
Glucose, UA: NEGATIVE mg/dL
Hgb urine dipstick: NEGATIVE
Ketones, ur: NEGATIVE mg/dL
Nitrite: POSITIVE — AB
Protein, ur: NEGATIVE mg/dL
Specific Gravity, Urine: 1.016 (ref 1.005–1.030)
pH: 6 (ref 5.0–8.0)

## 2019-08-22 LAB — LACTIC ACID, PLASMA: Lactic Acid, Venous: 0.8 mmol/L (ref 0.5–1.9)

## 2019-08-22 MED ORDER — SODIUM CHLORIDE 0.9% FLUSH: 3.0000 mL | Freq: Once | INTRAVENOUS | Status: DC

## 2019-08-22 NOTE — ED Triage Notes (Signed)
Pt arrives to ED w/ c/o dog bite to L hand that occurred on Monday. Pt here today for worsening wound and concern for infection. Pt states dog does have rabies vaccine.

## 2019-08-23 ENCOUNTER — Encounter (HOSPITAL_COMMUNITY)
Admission: RE | Disposition: A | Discharge status: Home / Self Care | Payer: Self-pay | Source: Home / Self Care | Attending: Internal Medicine

## 2019-08-23 ENCOUNTER — Observation Stay (HOSPITAL_COMMUNITY): Payer: Self-pay | Admitting: Anesthesiology

## 2019-08-23 ENCOUNTER — Encounter (HOSPITAL_COMMUNITY): Payer: Self-pay | Admitting: Internal Medicine

## 2019-08-23 ENCOUNTER — Other Ambulatory Visit: Payer: Self-pay

## 2019-08-23 DIAGNOSIS — G8929 Other chronic pain: Secondary | ICD-10-CM | POA: Diagnosis present

## 2019-08-23 DIAGNOSIS — W540XXA Bitten by dog, initial encounter: Secondary | ICD-10-CM

## 2019-08-23 DIAGNOSIS — S61452A Open bite of left hand, initial encounter: Secondary | ICD-10-CM | POA: Diagnosis present

## 2019-08-23 DIAGNOSIS — F1220 Cannabis dependence, uncomplicated: Secondary | ICD-10-CM | POA: Diagnosis present

## 2019-08-23 DIAGNOSIS — F418 Other specified anxiety disorders: Secondary | ICD-10-CM | POA: Diagnosis present

## 2019-08-23 HISTORY — DX: Bitten by dog, initial encounter: W54.0XXA

## 2019-08-23 HISTORY — DX: Open bite of left hand, initial encounter: S61.452A

## 2019-08-23 HISTORY — PX: I & D EXTREMITY: SHX5045

## 2019-08-23 LAB — RAPID URINE DRUG SCREEN, HOSP PERFORMED
Amphetamines: NOT DETECTED
Barbiturates: NOT DETECTED
Benzodiazepines: POSITIVE — AB
Cocaine: NOT DETECTED
Opiates: NOT DETECTED
Tetrahydrocannabinol: POSITIVE — AB

## 2019-08-23 LAB — SARS CORONAVIRUS 2 BY RT PCR (HOSPITAL ORDER, PERFORMED IN ~~LOC~~ HOSPITAL LAB): SARS Coronavirus 2: NEGATIVE

## 2019-08-23 LAB — HIV ANTIBODY (ROUTINE TESTING W REFLEX): HIV Screen 4th Generation wRfx: NONREACTIVE

## 2019-08-23 SURGERY — IRRIGATION AND DEBRIDEMENT EXTREMITY
Anesthesia: General | Laterality: Left

## 2019-08-23 MED ORDER — LACTATED RINGERS IV SOLN: INTRAVENOUS | Status: DC | PRN

## 2019-08-23 MED ORDER — FENTANYL CITRATE (PF) 100 MCG/2ML IJ SOLN
INTRAMUSCULAR | Status: AC
  Filled 2019-08-23: qty 2

## 2019-08-23 MED ORDER — ACETAMINOPHEN 325 MG PO TABS: 650.0000 mg | ORAL_TABLET | Freq: Four times a day (QID) | ORAL | Status: DC | PRN

## 2019-08-23 MED ORDER — CHLORHEXIDINE GLUCONATE 0.12 % MT SOLN
15.0000 mL | Freq: Once | OROMUCOSAL | Status: AC
  Administered 2019-08-23: 15 mL via OROMUCOSAL
  Filled 2019-08-23: qty 15

## 2019-08-23 MED ORDER — LACTATED RINGERS IV SOLN: INTRAVENOUS | Status: DC

## 2019-08-23 MED ORDER — LIDOCAINE 2% (20 MG/ML) 5 ML SYRINGE
INTRAMUSCULAR | Status: AC
  Filled 2019-08-23: qty 10

## 2019-08-23 MED ORDER — NICOTINE 21 MG/24HR TD PT24
21.0000 mg | MEDICATED_PATCH | Freq: Every day | TRANSDERMAL | Status: DC
  Administered 2019-08-23 – 2019-08-25 (×3): 21 mg via TRANSDERMAL
  Filled 2019-08-23 (×3): qty 1

## 2019-08-23 MED ORDER — FENTANYL CITRATE (PF) 100 MCG/2ML IJ SOLN
25.0000 ug | INTRAMUSCULAR | Status: DC | PRN
  Administered 2019-08-23 (×2): 50 ug via INTRAVENOUS

## 2019-08-23 MED ORDER — ROCURONIUM BROMIDE 10 MG/ML (PF) SYRINGE
PREFILLED_SYRINGE | INTRAVENOUS | Status: AC
  Filled 2019-08-23: qty 20

## 2019-08-23 MED ORDER — ONDANSETRON HCL 4 MG/2ML IJ SOLN
INTRAMUSCULAR | Status: AC
  Filled 2019-08-23: qty 6

## 2019-08-23 MED ORDER — SODIUM CHLORIDE 0.9 % IV SOLN
3.0000 g | Freq: Three times a day (TID) | INTRAVENOUS | Status: DC
  Administered 2019-08-23 – 2019-08-25 (×5): 3 g via INTRAVENOUS
  Filled 2019-08-23: qty 3
  Filled 2019-08-23: qty 8
  Filled 2019-08-23 (×4): qty 3
  Filled 2019-08-23 (×2): qty 8

## 2019-08-23 MED ORDER — PROPOFOL 10 MG/ML IV BOLUS
INTRAVENOUS | Status: AC
  Filled 2019-08-23: qty 40

## 2019-08-23 MED ORDER — CHLORHEXIDINE GLUCONATE 0.12 % MT SOLN
OROMUCOSAL | Status: AC
  Filled 2019-08-23: qty 15

## 2019-08-23 MED ORDER — MORPHINE SULFATE (PF) 2 MG/ML IV SOLN
2.0000 mg | INTRAVENOUS | Status: DC | PRN
  Administered 2019-08-23 – 2019-08-24 (×4): 2 mg via INTRAVENOUS
  Filled 2019-08-23 (×4): qty 1

## 2019-08-23 MED ORDER — PHENYLEPHRINE 40 MCG/ML (10ML) SYRINGE FOR IV PUSH (FOR BLOOD PRESSURE SUPPORT)
PREFILLED_SYRINGE | INTRAVENOUS | Status: AC
  Filled 2019-08-23: qty 10

## 2019-08-23 MED ORDER — DEXAMETHASONE SODIUM PHOSPHATE 10 MG/ML IJ SOLN
INTRAMUSCULAR | Status: AC
  Filled 2019-08-23: qty 1

## 2019-08-23 MED ORDER — ONDANSETRON HCL 4 MG/2ML IJ SOLN
INTRAMUSCULAR | Status: DC | PRN
  Administered 2019-08-23: 4 mg via INTRAVENOUS

## 2019-08-23 MED ORDER — ACETAMINOPHEN 650 MG RE SUPP: 650.0000 mg | Freq: Four times a day (QID) | RECTAL | Status: DC | PRN

## 2019-08-23 MED ORDER — TETANUS-DIPHTH-ACELL PERTUSSIS 5-2.5-18.5 LF-MCG/0.5 IM SUSP
0.5000 mL | Freq: Once | INTRAMUSCULAR | Status: AC
  Administered 2019-08-23: 0.5 mL via INTRAMUSCULAR
  Filled 2019-08-23: qty 0.5

## 2019-08-23 MED ORDER — SERTRALINE HCL 100 MG PO TABS
100.0000 mg | ORAL_TABLET | Freq: Two times a day (BID) | ORAL | Status: DC
  Administered 2019-08-23 – 2019-08-25 (×4): 100 mg via ORAL
  Filled 2019-08-23 (×5): qty 1

## 2019-08-23 MED ORDER — PROMETHAZINE HCL 25 MG/ML IJ SOLN: 6.2500 mg | INTRAMUSCULAR | Status: DC | PRN

## 2019-08-23 MED ORDER — PROPOFOL 10 MG/ML IV BOLUS
INTRAVENOUS | Status: DC | PRN
  Administered 2019-08-23: 170 mg via INTRAVENOUS

## 2019-08-23 MED ORDER — LIDOCAINE HCL (CARDIAC) PF 100 MG/5ML IV SOSY
PREFILLED_SYRINGE | INTRAVENOUS | Status: DC | PRN
Start: 2019-08-23 — End: 2019-08-23
  Administered 2019-08-23: 60 mg via INTRATRACHEAL

## 2019-08-23 MED ORDER — EPHEDRINE 5 MG/ML INJ
INTRAVENOUS | Status: AC
  Filled 2019-08-23: qty 10

## 2019-08-23 MED ORDER — LIDOCAINE 2% (20 MG/ML) 5 ML SYRINGE
INTRAMUSCULAR | Status: AC
  Filled 2019-08-23: qty 5

## 2019-08-23 MED ORDER — SODIUM CHLORIDE 0.9 % IR SOLN
Status: DC | PRN
  Administered 2019-08-23: 3000 mL

## 2019-08-23 MED ORDER — ONDANSETRON HCL 4 MG PO TABS: 4.0000 mg | ORAL_TABLET | Freq: Four times a day (QID) | ORAL | Status: DC | PRN

## 2019-08-23 MED ORDER — FENTANYL CITRATE (PF) 250 MCG/5ML IJ SOLN
INTRAMUSCULAR | Status: AC
  Filled 2019-08-23: qty 5

## 2019-08-23 MED ORDER — MIDAZOLAM HCL 2 MG/2ML IJ SOLN
INTRAMUSCULAR | Status: AC
  Filled 2019-08-23: qty 4

## 2019-08-23 MED ORDER — MIDAZOLAM HCL 2 MG/2ML IJ SOLN
INTRAMUSCULAR | Status: DC | PRN
Start: 2019-08-23 — End: 2019-08-23
  Administered 2019-08-23: 2 mg via INTRAVENOUS

## 2019-08-23 MED ORDER — SUCCINYLCHOLINE CHLORIDE 200 MG/10ML IV SOSY
PREFILLED_SYRINGE | INTRAVENOUS | Status: AC
  Filled 2019-08-23: qty 30

## 2019-08-23 MED ORDER — ALPRAZOLAM 0.5 MG PO TABS
1.0000 mg | ORAL_TABLET | Freq: Three times a day (TID) | ORAL | Status: DC
  Administered 2019-08-23 – 2019-08-25 (×6): 1 mg via ORAL
  Filled 2019-08-23 (×6): qty 2

## 2019-08-23 MED ORDER — ONDANSETRON HCL 4 MG/2ML IJ SOLN: 4.0000 mg | Freq: Four times a day (QID) | INTRAMUSCULAR | Status: DC | PRN

## 2019-08-23 MED ORDER — PROPOFOL 10 MG/ML IV BOLUS
INTRAVENOUS | Status: AC
  Filled 2019-08-23: qty 20

## 2019-08-23 MED ORDER — FENTANYL CITRATE (PF) 250 MCG/5ML IJ SOLN
INTRAMUSCULAR | Status: DC | PRN
  Administered 2019-08-23 (×2): 50 ug via INTRAVENOUS
  Administered 2019-08-23: 100 ug via INTRAVENOUS
  Administered 2019-08-23: 50 ug via INTRAVENOUS

## 2019-08-23 MED ORDER — ASPIRIN 325 MG PO TABS
325.0000 mg | ORAL_TABLET | Freq: Every day | ORAL | Status: DC
  Administered 2019-08-24 – 2019-08-25 (×2): 325 mg via ORAL
  Filled 2019-08-23 (×2): qty 1

## 2019-08-23 MED ORDER — ONDANSETRON HCL 4 MG/2ML IJ SOLN
INTRAMUSCULAR | Status: AC
  Filled 2019-08-23: qty 2

## 2019-08-23 MED ORDER — EPHEDRINE SULFATE 50 MG/ML IJ SOLN
INTRAMUSCULAR | Status: DC | PRN
Start: 2019-08-23 — End: 2019-08-23
  Administered 2019-08-23: 5 mg via INTRAVENOUS

## 2019-08-23 MED ORDER — SODIUM CHLORIDE 0.9 % IV SOLN
3.0000 g | Freq: Once | INTRAVENOUS | Status: AC
  Administered 2019-08-23: 3 g via INTRAVENOUS
  Filled 2019-08-23: qty 3

## 2019-08-23 SURGICAL SUPPLY — 40 items
BNDG ELASTIC 4X5.8 VLCR STR LF (GAUZE/BANDAGES/DRESSINGS) ×2 IMPLANT
BNDG GAUZE ELAST 4 BULKY (GAUZE/BANDAGES/DRESSINGS) ×4 IMPLANT
CORD BIPOLAR FORCEPS 12FT (ELECTRODE) ×2 IMPLANT
COVER SURGICAL LIGHT HANDLE (MISCELLANEOUS) ×2 IMPLANT
COVER WAND RF STERILE (DRAPES) ×2 IMPLANT
CUFF TOURN SGL QUICK 18X4 (TOURNIQUET CUFF) ×2 IMPLANT
CUFF TOURN SGL QUICK 24 (TOURNIQUET CUFF)
CUFF TRNQT CYL 24X4X16.5-23 (TOURNIQUET CUFF) IMPLANT
DRSG ADAPTIC 3X8 NADH LF (GAUZE/BANDAGES/DRESSINGS) ×2 IMPLANT
DRSG EMULSION OIL 3X3 NADH (GAUZE/BANDAGES/DRESSINGS) ×1 IMPLANT
GAUZE SPONGE 4X4 12PLY STRL (GAUZE/BANDAGES/DRESSINGS) ×2 IMPLANT
GAUZE XEROFORM 1X8 LF (GAUZE/BANDAGES/DRESSINGS) ×2 IMPLANT
GLOVE BIOGEL M 8.0 STRL (GLOVE) ×2 IMPLANT
GLOVE SS BIOGEL STRL SZ 8 (GLOVE) ×1 IMPLANT
GLOVE SUPERSENSE BIOGEL SZ 8 (GLOVE) ×1
GOWN STRL REUS W/ TWL LRG LVL3 (GOWN DISPOSABLE) ×1 IMPLANT
GOWN STRL REUS W/ TWL XL LVL3 (GOWN DISPOSABLE) ×2 IMPLANT
GOWN STRL REUS W/TWL LRG LVL3 (GOWN DISPOSABLE) ×2
GOWN STRL REUS W/TWL XL LVL3 (GOWN DISPOSABLE) ×4
KIT BASIN OR (CUSTOM PROCEDURE TRAY) ×2 IMPLANT
KIT TURNOVER KIT B (KITS) ×2 IMPLANT
MANIFOLD NEPTUNE II (INSTRUMENTS) ×2 IMPLANT
NS IRRIG 1000ML POUR BTL (IV SOLUTION) ×2 IMPLANT
PACK ORTHO EXTREMITY (CUSTOM PROCEDURE TRAY) ×2 IMPLANT
PAD ARMBOARD 7.5X6 YLW CONV (MISCELLANEOUS) ×2 IMPLANT
PAD CAST 4YDX4 CTTN HI CHSV (CAST SUPPLIES) ×1 IMPLANT
PADDING CAST COTTON 4X4 STRL (CAST SUPPLIES) ×2
PILLOW ARM CARTER ADULT (MISCELLANEOUS) ×1 IMPLANT
SET CYSTO W/LG BORE CLAMP LF (SET/KITS/TRAYS/PACK) ×2 IMPLANT
SOL PREP POV-IOD 4OZ 10% (MISCELLANEOUS) ×4 IMPLANT
SPLINT FIBERGLASS 3X12 (CAST SUPPLIES) ×1 IMPLANT
SPONGE LAP 4X18 RFD (DISPOSABLE) ×2 IMPLANT
SUT PROLENE 4 0 PS 2 18 (SUTURE) ×1 IMPLANT
SWAB CULTURE ESWAB REG 1ML (MISCELLANEOUS) IMPLANT
SYR CONTROL 10ML LL (SYRINGE) IMPLANT
TOWEL GREEN STERILE (TOWEL DISPOSABLE) ×2 IMPLANT
TOWEL GREEN STERILE FF (TOWEL DISPOSABLE) ×2 IMPLANT
TUBE CONNECTING 12X1/4 (SUCTIONS) ×2 IMPLANT
WATER STERILE IRR 1000ML POUR (IV SOLUTION) ×2 IMPLANT
YANKAUER SUCT BULB TIP NO VENT (SUCTIONS) ×2 IMPLANT

## 2019-08-23 NOTE — Transfer of Care (Signed)
Immediate Anesthesia Transfer of Care Note  Patient: Lindsay Joyce  Procedure(s) Performed: IRRIGATION AND DEBRIDEMENT EXTREMITY (Left )  Patient Location: PACU  Anesthesia Type:General  Level of Consciousness: awake  Airway & Oxygen Therapy: Patient Spontanous Breathing  Post-op Assessment: Report given to RN and Post -op Vital signs reviewed and stable  Post vital signs: Reviewed and stable  Last Vitals:  Vitals Value Taken Time  BP 161/120 08/23/19 1553  Temp    Pulse 91 08/23/19 1553  Resp 11 08/23/19 1553  SpO2 95 % 08/23/19 1553  Vitals shown include unvalidated device data.  Last Pain:  Vitals:   08/23/19 1143  TempSrc:   PainSc: 5          Complications: No complications documented.

## 2019-08-23 NOTE — Consult Note (Signed)
Reason for Consult:Dog bite Referring Physician: [name] is an 59 y.o. female.  HPI: Lindsay Joyce was bit by her pit bull on Monday of this week. She did not do much to clean it. The wound began to get progressively more painful and yesterday she noted some red streaking into her forearm along with pain. She came to the ED for evaluation and hand surgery was consulted. She is RHD.  Past Medical History:  Diagnosis Date   Anxiety    Chronic interstitial cystitis    Depression    Shortness of breath    Smoker    Stroke [facility])    questionable hx-years ago-no documentation    Past Surgical History:  Procedure Laterality Date   CHOLECYSTECTOMY  2008   CYSTO WITH HYDRODISTENSION N/A 03/12/2013   Procedure: CYSTOSCOPY/HYDRODISTENSION/INSTILLATION OF MARCAINE AND PYRIDIUM;  Surgeon: Bjorn Pippin, MD;  Location: [facility];  Service: Urology;  Laterality: N/A;   CYSTOSCOPY WITH URETHRAL DILATATION N/A 03/12/2013   Procedure: CYSTOSCOPY WITH URETHRAL DILATATION;  Surgeon: Bjorn Pippin, MD;  Location: [facility];  Service: Urology;  Laterality: N/A;   LAVH,BSO  12/09   URETHRAL DILATION/HOD  2008   X2    Family History  Problem Relation Age of Onset   Heart disease Mother    Cancer Mother        CERVICAL   Hypertension Mother        DECEASED   Ovarian cancer Mother    Cancer Father        PROSTATE   Hypertension Father        DECEASED    Social History:  reports that she has been smoking cigarettes. She has been smoking about 1.00 pack per day. She does not have any smokeless tobacco history on file. She reports current drug use. Drug: Marijuana. She reports that she does not drink alcohol.  Allergies:  Allergies  Allergen Reactions   Codeine     Other reaction(s): GI Upset (intolerance)   Gabapentin     Other reaction(s): GI Upset (intolerance)   Hydrocodone-Acetaminophen     Other reaction(s): GI Upset (intolerance)    Hydromorphone     Other reaction(s): GI Upset (intolerance)   Oxymorphone Itching    Medications: I have reviewed the patient's current medications.  Results for orders placed or performed during the hospital encounter of 08/22/19 (from the past 48 hour(s))  Urinalysis, Routine w reflex microscopic     Status: Abnormal   Collection Time: 08/22/19  5:27 PM  Result Value Ref Range   Color, Urine YELLOW YELLOW   APPearance HAZY (A) CLEAR   Specific Gravity, Urine 1.016 1.005 - 1.030   pH 6.0 5.0 - 8.0   Glucose, UA NEGATIVE NEGATIVE mg/dL   Hgb urine dipstick NEGATIVE NEGATIVE   Bilirubin Urine NEGATIVE NEGATIVE   Ketones, ur NEGATIVE NEGATIVE mg/dL   Protein, ur NEGATIVE NEGATIVE mg/dL   Nitrite POSITIVE (A) NEGATIVE   Leukocytes,Ua TRACE (A) NEGATIVE   RBC / HPF 0-5 0 - 5 RBC/hpf   WBC, UA 21-50 0 - 5 WBC/hpf   Bacteria, UA MANY (A) NONE SEEN   Squamous Epithelial / LPF 0-5 0 - 5   Mucus PRESENT     Comment: Performed at [facility], [address], [place], [place] [address]  Lactic acid, plasma     Status: None   Collection Time: 08/22/19  5:37 PM  Result Value Ref Range  Lactic Acid, Venous 0.8 0.5 - 1.9 mmol/L    Comment: Performed at [facility], [address], [place], [place] [address]  Comprehensive metabolic panel     Status: Abnormal   Collection Time: 08/22/19  5:37 PM  Result Value Ref Range   Sodium 137 135 - 145 mmol/L   Potassium 3.8 3.5 - 5.1 mmol/L   Chloride 102 98 - 111 mmol/L   CO2 25 22 - 32 mmol/L   Glucose, Bld 96 70 - 99 mg/dL    Comment: Glucose reference range applies only to samples taken after fasting for at least 8 hours.   BUN 6 6 - 20 mg/dL   Creatinine, Ser 1.91 (H) 0.44 - 1.00 mg/dL   Calcium 8.4 (L) 8.9 - 10.3 mg/dL   Total Protein 6.8 6.5 - 8.1 g/dL   Albumin 3.8 3.5 - 5.0 g/dL   AST 15 15 - 41 U/L   ALT 10 0 - 44 U/L   Alkaline Phosphatase 29 (L) 38 - 126 U/L   Total Bilirubin 0.3 0.3 - 1.2 mg/dL   GFR calc non Af  Amer >60 >60 mL/min   GFR calc Af Amer >60 >60 mL/min   Anion gap 10 5 - 15    Comment: Performed at [facility], [address] [place], [place] [address]  CBC with Differential     Status: None   Collection Time: 08/22/19  5:37 PM  Result Value Ref Range   WBC 9.8 4.0 - 10.5 K/uL   RBC 5.02 3.87 - 5.11 MIL/uL   Hemoglobin 15.0 12.0 - 15.0 g/dL   HCT 56.2 36 - 46 %   MCV 90.6 80.0 - 100.0 fL   MCH 29.9 26.0 - 34.0 pg   MCHC 33.0 30.0 - 36.0 g/dL   RDW 13.0 86.5 - 78.4 %   Platelets 374 150 - 400 K/uL   nRBC 0.0 0.0 - 0.2 %   Neutrophils Relative % 54 %   Neutro Abs 5.3 1.7 - 7.7 K/uL   Lymphocytes Relative 38 %   Lymphs Abs 3.7 0.7 - 4.0 K/uL   Monocytes Relative 5 %   Monocytes Absolute 0.5 0 - 1 K/uL   Eosinophils Relative 2 %   Eosinophils Absolute 0.2 0 - 0 K/uL   Basophils Relative 1 %   Basophils Absolute 0.1 0 - 0 K/uL   Immature Granulocytes 0 %   Abs Immature Granulocytes 0.02 0.00 - 0.07 K/uL    Comment: Performed at [facility], [address] [place], [place] [address]    DG Hand Complete Left  Result Date: 08/22/2019 CLINICAL DATA:  Dog bite possible infection EXAM: LEFT HAND - COMPLETE 3+ VIEW COMPARISON:  None. FINDINGS: No fracture or malalignment. No periostitis or bone destruction. No soft tissue emphysema or radiopaque foreign body. Mild arthritis at the first [facility] joint. IMPRESSION: No acute osseous abnormality. Electronically Signed   By: Jasmine Pang M.D.   On: 08/22/2019 18:08    Review of Systems  Constitutional: Negative for chills, diaphoresis and fever.  HENT: Negative for ear discharge, ear pain, hearing loss and tinnitus.   Eyes: Negative for photophobia and pain.  Respiratory: Negative for cough and shortness of breath.   Cardiovascular: Negative for chest pain.  Gastrointestinal: Negative for abdominal pain, nausea and vomiting.  Genitourinary: Negative for dysuria, flank pain, frequency and urgency.  Musculoskeletal:  Positive for myalgias (Left hand/forearm). Negative for back pain and neck pain.  Neurological: Negative for dizziness and headaches.  Hematological: Does not bruise/bleed easily.  Psychiatric/Behavioral: The patient is not nervous/anxious.    Blood pressure (!) 148/89, pulse 75, temperature 98.5 F (36.9 C), temperature source Oral, resp. rate 18, height 5\' 1"  (1.549 m), weight 63 kg, last menstrual period 03/05/2008, SpO2 96 %. Physical Exam  Constitutional: She appears well-developed. No distress.  HENT:  Head: Normocephalic and atraumatic.  Eyes: Conjunctivae are normal. Right eye exhibits no discharge. Left eye exhibits no discharge. No scleral icterus.  Cardiovascular: Normal rate and regular rhythm.  Respiratory: Effort normal. No respiratory distress.  Musculoskeletal:     Cervical back: Normal range of motion.     Comments: Left shoulder, elbow, wrist, digits- Curvilinear laceration to 1st webspace, mod TTP, no instability, no blocks to motion  Sens  Ax/R/M/U intact  Mot   Ax/ R/ PIN/ M/ AIN/ U intact  Rad 2+  Neurological: She is alert.  Skin: Skin is warm and dry. She is not diaphoretic.  Psychiatric: Her behavior is normal.    Assessment/Plan: Left hand dog bite -- Will plan I&D by Dr. Amedeo Plenty later today. Please keep NPO. Given hematogenous spread have asked medicine to admit and manage IV abx. Multiple medical problems including anxiety, chronic interstitial cystitis, and depression -- per primary service    Lisette Abu, PA-C Orthopedic Surgery [phone] 08/23/2019, 10:34 AM

## 2019-08-23 NOTE — Plan of Care (Signed)

## 2019-08-23 NOTE — Op Note (Signed)
Operative note August 23, 2019  Dominica Severin MD  Preoperative diagnosis: Left hand dog bite with late presentation and noted infection with ascending cellulitis lymphangitis and the first webspace soft tissue defect.  Postop diagnosis: The same.  Operative procedure #1 irrigation debridement deep abscess left hand about the first webspace #2 tenolysis tenosynovectomy abductor pollicis and extensor apparatus left hand #3 rotation flap left hand due to soft tissue defect  Surgeon Dominica Severin  Anesthesia General  Tourniquet time less than an hour  Cultures aerobic and anaerobic taken  Estimated blood loss minimal  Description of procedure: This patient is a pleasant female who presents after dog bite wound days ago with ascending cellulitis lymphangitis and an abscess about her hand.  I discussed with the patient risk and benefits of surgery there is and don'ts and other aspects of the care plan.  With this in mind she desires to proceed.  Patient was taken to the operative theater and underwent smooth induction of general anesthesia she was prepped and draped with Hibiclens prescrub followed by 10-minute surgical Betadine scrub and paint once this was complete the patient then underwent a very careful and cautious approach to the extremity with excision of nonviable skin subcu and deeper tissue including muscle.  This was an excisional debridement of an abscess with curette knife and scissor.  Aerobic and anaerobic cultures were taken.  This time I performed a abductor muscle and tendon tenolysis tenosynovectomy and remove nonviable tissue as well as an extensor tendon extensor tendon synovectomy.  The patient tolerated this well.  Following this we placed 3 L through and through the area for purposes of irrigant.  Following this I then made back cuts about the distal portion overlying the dorsal thumb for rotation flap to give coverage over musculotendinous features.  A 15 blade was  used for this.  I very gently mobilized the flap and there were no complicating features.  Once this was complete we then inset the flap with the tourniquet deflated.  This provided adequate coverage.  I placed a vessel loop drain in 2 areas to allow for secondary healing in this region as a window technique.  The patient tolerated this well and there were no complications.  I placed a sterile dressing with Adaptic Xeroform 4 x 4's and a volar splint.  I will see her daily.  Should any problems occur she will notify me.  Asani Mcburney MD

## 2019-08-23 NOTE — Anesthesia Procedure Notes (Signed)
Procedure Name: LMA Insertion Date/Time: 08/23/2019 3:20 PM Performed by: Claudina Lick, CRNA Pre-anesthesia Checklist: Patient identified, Emergency Drugs available, Suction available, Patient being monitored and Timeout performed Patient Re-evaluated:Patient Re-evaluated prior to induction Oxygen Delivery Method: Circle system utilized Preoxygenation: Pre-oxygenation with 100% oxygen Induction Type: IV induction Ventilation: Mask ventilation without difficulty LMA: LMA inserted LMA Size: 4.0 Number of attempts: 1 Placement Confirmation: positive ETCO2 and breath sounds checked- equal and bilateral Tube secured with: Tape Dental Injury: Teeth and Oropharynx as per pre-operative assessment
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
## 2019-08-23 NOTE — Anesthesia Preprocedure Evaluation (Addendum)
Anesthesia Evaluation  Patient identified by MRN, date of birth, ID band Patient awake    Reviewed: Allergy & Precautions, NPO status , Patient's Chart, lab work & pertinent test results  History of Anesthesia Complications Negative for: history of anesthetic complications  Airway Mallampati: II  TM Distance: >3 FB Neck ROM: Full    Dental  (+) Upper Dentures, Lower Dentures   Pulmonary Current Smoker and Patient abstained from smoking.,    Pulmonary exam normal        Cardiovascular negative cardio ROS Normal cardiovascular exam     Neuro/Psych Seizures -, Well Controlled,  PSYCHIATRIC DISORDERS Anxiety Depression    GI/Hepatic GERD  Medicated and Controlled,(+)     substance abuse  marijuana use,   Endo/Other  negative endocrine ROS  Renal/GU negative Renal ROS    Chronic interstitial cystitis     Musculoskeletal negative musculoskeletal ROS (+)   Abdominal   Peds  Hematology negative hematology ROS (+)   Anesthesia Other Findings Covid test negative   Reproductive/Obstetrics                           Anesthesia Physical Anesthesia Plan  ASA: II  Anesthesia Plan: General   Post-op Pain Management:    Induction: Intravenous  PONV Risk Score and Plan: 1 and Treatment may vary due to age or medical condition, Midazolam, Dexamethasone and Ondansetron  Airway Management Planned: LMA  Additional Equipment: None  Intra-op Plan:   Post-operative Plan: Extubation in OR  Informed Consent: I have reviewed the patients History and Physical, chart, labs and discussed the procedure including the risks, benefits and alternatives for the proposed anesthesia with the patient or authorized representative who has indicated his/her understanding and acceptance.     Dental advisory given  Plan Discussed with: CRNA and Anesthesiologist  Anesthesia Plan Comments:       Anesthesia  Quick Evaluation

## 2019-08-23 NOTE — H&P (Signed)
  I have reviewed the patient's presenting symptoms and exam.  I agree with the assessment.  This is an infected left hand first webspace.  We will plan for irrigation debridement rotation flap is necessary and repair reconstruction.  She will be admitted for IV Unasyn and observation after the surgery.  All questions have been encouraged and answered.  Tammey Deeg MD

## 2019-08-23 NOTE — ED Notes (Signed)
Pt refusing recheck of vitals.

## 2019-08-24 DIAGNOSIS — S61452A Open bite of left hand, initial encounter: Secondary | ICD-10-CM

## 2019-08-24 DIAGNOSIS — G8929 Other chronic pain: Secondary | ICD-10-CM

## 2019-08-24 DIAGNOSIS — F418 Other specified anxiety disorders: Secondary | ICD-10-CM

## 2019-08-24 DIAGNOSIS — W540XXA Bitten by dog, initial encounter: Secondary | ICD-10-CM | POA: Diagnosis present

## 2019-08-24 DIAGNOSIS — F1220 Cannabis dependence, uncomplicated: Secondary | ICD-10-CM

## 2019-08-24 DIAGNOSIS — F17200 Nicotine dependence, unspecified, uncomplicated: Secondary | ICD-10-CM

## 2019-08-24 LAB — BASIC METABOLIC PANEL
Anion gap: 9 (ref 5–15)
BUN: 5 mg/dL — ABNORMAL LOW (ref 6–20)
CO2: 26 mmol/L (ref 22–32)
Calcium: 7.9 mg/dL — ABNORMAL LOW (ref 8.9–10.3)
Chloride: 104 mmol/L (ref 98–111)
Creatinine, Ser: 0.86 mg/dL (ref 0.44–1.00)
GFR calc Af Amer: >60 mL/min (ref >60)
GFR calc non Af Amer: >60 mL/min (ref >60)
Glucose, Bld: 93 mg/dL (ref 70–99)
Potassium: 3.4 mmol/L — ABNORMAL LOW (ref 3.5–5.1)
Sodium: 139 mmol/L (ref 135–145)

## 2019-08-24 LAB — CBC
HCT: 38 % (ref 36.0–46.0)
Hemoglobin: 12.3 g/dL (ref 12.0–15.0)
MCH: 29.4 pg (ref 26.0–34.0)
MCHC: 32.4 g/dL (ref 30.0–36.0)
MCV: 90.9 fL (ref 80.0–100.0)
Platelets: 282 10*3/uL (ref 150–400)
RBC: 4.18 MIL/uL (ref 3.87–5.11)
RDW: 13.4 % (ref 11.5–15.5)
WBC: 6.4 10*3/uL (ref 4.0–10.5)
nRBC: 0 % (ref 0.0–0.2)

## 2019-08-24 MED ORDER — PANTOPRAZOLE SODIUM 40 MG PO TBEC
40.0000 mg | DELAYED_RELEASE_TABLET | Freq: Every day | ORAL | Status: DC
  Administered 2019-08-24 – 2019-08-25 (×2): 40 mg via ORAL
  Filled 2019-08-24 (×2): qty 1

## 2019-08-24 MED ORDER — MAGNESIUM OXIDE 400 (241.3 MG) MG PO TABS
400.0000 mg | ORAL_TABLET | Freq: Two times a day (BID) | ORAL | Status: DC
  Administered 2019-08-24 – 2019-08-25 (×3): 400 mg via ORAL
  Filled 2019-08-24 (×3): qty 1

## 2019-08-24 MED ORDER — POTASSIUM CHLORIDE CRYS ER 20 MEQ PO TBCR
40.0000 meq | EXTENDED_RELEASE_TABLET | Freq: Once | ORAL | Status: AC
  Administered 2019-08-24: 40 meq via ORAL
  Filled 2019-08-24: qty 2

## 2019-08-24 MED ORDER — OXYCODONE-ACETAMINOPHEN 5-325 MG PO TABS
1.0000 | ORAL_TABLET | Freq: Four times a day (QID) | ORAL | Status: DC | PRN
  Administered 2019-08-24 – 2019-08-25 (×3): 2 via ORAL
  Filled 2019-08-24 (×3): qty 2

## 2019-08-24 MED ORDER — KETOROLAC TROMETHAMINE 30 MG/ML IJ SOLN
30.0000 mg | Freq: Four times a day (QID) | INTRAMUSCULAR | Status: DC
  Administered 2019-08-24 – 2019-08-25 (×6): 30 mg via INTRAVENOUS
  Filled 2019-08-24 (×6): qty 1
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
## 2019-08-24 NOTE — Consult Note (Signed)
  Patient seen at bedside today.  Patient is doing reasonably well.  There is no advancing cellulitic change or lymphangitis.  She and I discussed all issues I would encourage elevation range of motion and will continue Unasyn today.  I will plan to take down her dressing tomorrow and begin wet-to-dry dressing changes and hopefully advance her to Augmentin 875 twice daily x14 days and wet-to-dry dressing changes with close outpatient follow-up in my office.  We discussed these issues at length.  Overall fairly stable exam.  Virgilia Quigg MD

## 2019-08-24 NOTE — Anesthesia Postprocedure Evaluation (Signed)
Anesthesia Post Note  Patient: Lindsay Joyce  Procedure(s) Performed: IRRIGATION AND DEBRIDEMENT EXTREMITY (Left )     Patient location during evaluation: PACU Anesthesia Type: General Level of consciousness: awake and alert Pain management: pain level controlled Vital Signs Assessment: post-procedure vital signs reviewed and stable Respiratory status: spontaneous breathing, nonlabored ventilation, respiratory function stable and patient connected to nasal cannula oxygen Cardiovascular status: stable and blood pressure returned to baseline Postop Assessment: no apparent nausea or vomiting Anesthetic complications: no   No complications documented.  Last Vitals:  Vitals:   08/23/19 2020 08/24/19 0425  BP: 121/77 120/69  Pulse: 62 81  Resp: 16 14  Temp: 36.8 C 36.8 C  SpO2: 95% 95%    Last Pain:  Vitals:   08/24/19 0758  TempSrc:   PainSc: 5                  Sequoia Witz

## 2019-08-25 ENCOUNTER — Encounter (HOSPITAL_COMMUNITY): Payer: Self-pay | Admitting: Orthopedic Surgery

## 2019-08-25 LAB — URINE CULTURE: Culture: >=100000 — AB

## 2019-08-25 MED ORDER — OXYCODONE-ACETAMINOPHEN 5-325 MG PO TABS: 1.0000 | ORAL_TABLET | Freq: Three times a day (TID) | ORAL | 0 refills | Status: AC | PRN

## 2019-08-25 MED ORDER — OXYCODONE-ACETAMINOPHEN 5-325 MG PO TABS: 1.0000 | ORAL_TABLET | Freq: Three times a day (TID) | ORAL | 0 refills | Status: DC | PRN

## 2019-08-25 MED ORDER — AMOXICILLIN-POT CLAVULANATE 875-125 MG PO TABS
1.0000 | ORAL_TABLET | Freq: Two times a day (BID) | ORAL | 0 refills | Status: DC
Start: 2019-08-25 — End: 2019-08-25

## 2019-08-25 MED ORDER — AMOXICILLIN-POT CLAVULANATE 875-125 MG PO TABS
1.0000 | ORAL_TABLET | Freq: Two times a day (BID) | ORAL | 0 refills | Status: AC
Start: 2019-08-25 — End: 2019-09-08

## 2019-08-25 NOTE — Care Management (Signed)
Discussed necessary prescriptions with patient.  Her pharmacy is closed on Sundays and she states she normally uses the Good [facility] app.  Her total with [facility] coupons will be around $21 at [facility], which she says she can pay.  She requests that scripts be sent to [facility] on [address]
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
## 2019-08-25 NOTE — Progress Notes (Signed)
Patient ID: Lindsay Joyce, female   DOB: 1960/05/15, 59 y.o.   MRN: [id number] Patient seen and evaluated at bedside.  She is doing quite well.  Wound looks great.  I discussed wet-to-dry dressing changes and instructed the patient on how to perform wet-to-dry dressing changes and also gave her ample materials for dressing changes over the next 10 to 14 days.  I would recommend Augmentin 875 mg twice daily for 14 days and return to see me in the office in 10 to 14 days for follow-up.  All questions have been addressed.  Charity Tessier MD

## 2019-08-28 LAB — AEROBIC/ANAEROBIC CULTURE W GRAM STAIN (SURGICAL/DEEP WOUND)

## 2020-01-06 ENCOUNTER — Other Ambulatory Visit: Payer: Self-pay

## 2020-01-06 ENCOUNTER — Encounter (HOSPITAL_COMMUNITY): Payer: Self-pay

## 2020-01-06 ENCOUNTER — Emergency Department (HOSPITAL_COMMUNITY)
Admission: EM | Admit: 2020-01-06 | Discharge: 2020-01-07 | Disposition: A | Discharge status: Home / Self Care | Payer: Self-pay | Attending: Emergency Medicine | Admitting: Emergency Medicine

## 2020-01-06 DIAGNOSIS — M549 Dorsalgia, unspecified: Secondary | ICD-10-CM | POA: Insufficient documentation

## 2020-01-06 DIAGNOSIS — R062 Wheezing: Secondary | ICD-10-CM | POA: Insufficient documentation

## 2020-01-06 DIAGNOSIS — F17210 Nicotine dependence, cigarettes, uncomplicated: Secondary | ICD-10-CM | POA: Insufficient documentation

## 2020-01-06 DIAGNOSIS — K5900 Constipation, unspecified: Secondary | ICD-10-CM | POA: Insufficient documentation

## 2020-01-06 DIAGNOSIS — Z7982 Long term (current) use of aspirin: Secondary | ICD-10-CM | POA: Insufficient documentation

## 2020-01-06 LAB — LIPASE, BLOOD: Lipase: 25 U/L (ref 11–51)

## 2020-01-06 LAB — COMPREHENSIVE METABOLIC PANEL
ALT: 6 U/L (ref 0–44)
AST: 19 U/L (ref 15–41)
Albumin: 3.9 g/dL (ref 3.5–5.0)
Alkaline Phosphatase: 25 U/L — ABNORMAL LOW (ref 38–126)
Anion gap: 9 (ref 5–15)
BUN: 7 mg/dL (ref 6–20)
CO2: 27 mmol/L (ref 22–32)
Calcium: 8.8 mg/dL — ABNORMAL LOW (ref 8.9–10.3)
Chloride: 101 mmol/L (ref 98–111)
Creatinine, Ser: 0.99 mg/dL (ref 0.44–1.00)
GFR, Estimated: >60 mL/min (ref >60)
Glucose, Bld: 102 mg/dL — ABNORMAL HIGH (ref 70–99)
Potassium: 4.5 mmol/L (ref 3.5–5.1)
Sodium: 137 mmol/L (ref 135–145)
Total Bilirubin: 0.8 mg/dL (ref 0.3–1.2)
Total Protein: 7.2 g/dL (ref 6.5–8.1)

## 2020-01-06 LAB — URINALYSIS, ROUTINE W REFLEX MICROSCOPIC
Bilirubin Urine: NEGATIVE
Glucose, UA: NEGATIVE mg/dL
Ketones, ur: 20 mg/dL — AB
Nitrite: NEGATIVE
Protein, ur: 30 mg/dL — AB
Specific Gravity, Urine: 1.026 (ref 1.005–1.030)
pH: 5 (ref 5.0–8.0)

## 2020-01-06 LAB — CBC
HCT: 46.1 % — ABNORMAL HIGH (ref 36.0–46.0)
Hemoglobin: 14.6 g/dL (ref 12.0–15.0)
MCH: 28.9 pg (ref 26.0–34.0)
MCHC: 31.7 g/dL (ref 30.0–36.0)
MCV: 91.3 fL (ref 80.0–100.0)
Platelets: 366 10*3/uL (ref 150–400)
RBC: 5.05 MIL/uL (ref 3.87–5.11)
RDW: 13.7 % (ref 11.5–15.5)
WBC: 9.8 10*3/uL (ref 4.0–10.5)
nRBC: 0 % (ref 0.0–0.2)

## 2020-01-06 MED ORDER — IBUPROFEN 400 MG PO TABS
400.0000 mg | ORAL_TABLET | Freq: Once | ORAL | Status: AC | PRN
  Administered 2020-01-06: 400 mg via ORAL
  Filled 2020-01-06: qty 1

## 2020-01-06 NOTE — ED Notes (Signed)
Refused vitals 

## 2020-01-06 NOTE — ED Notes (Signed)
callx4 for repeat vitalsigns

## 2020-01-06 NOTE — ED Triage Notes (Signed)
Pt reports 1 week of left sided flank pain that radiates to her back. No urinary s/s. Pain worse with movement. No n/v at this time.

## 2020-01-07 ENCOUNTER — Emergency Department (HOSPITAL_COMMUNITY): Payer: Self-pay

## 2020-01-07 MED ORDER — MORPHINE SULFATE (PF) 4 MG/ML IV SOLN
4.0000 mg | Freq: Once | INTRAVENOUS | Status: AC
  Administered 2020-01-07: 4 mg via INTRAVENOUS
  Filled 2020-01-07: qty 1

## 2020-01-07 MED ORDER — KETOROLAC TROMETHAMINE 30 MG/ML IJ SOLN
30.0000 mg | Freq: Once | INTRAMUSCULAR | Status: AC
  Administered 2020-01-07: 30 mg via INTRAVENOUS
  Filled 2020-01-07: qty 1

## 2020-01-07 MED ORDER — ONDANSETRON HCL 4 MG/2ML IJ SOLN
4.0000 mg | Freq: Once | INTRAMUSCULAR | Status: AC
  Administered 2020-01-07: 4 mg via INTRAVENOUS
  Filled 2020-01-07: qty 2

## 2020-01-07 MED ORDER — SODIUM CHLORIDE 0.9 % IV BOLUS
1000.0000 mL | Freq: Once | INTRAVENOUS | Status: AC
  Administered 2020-01-07: 1000 mL via INTRAVENOUS

## 2020-01-07 MED ORDER — AEROCHAMBER PLUS FLO-VU LARGE MISC: 1.0000 | Freq: Once | Status: DC

## 2020-01-07 MED ORDER — BISACODYL 5 MG PO TBEC
DELAYED_RELEASE_TABLET | ORAL | 0 refills | Status: DC
Start: 2020-01-07 — End: 2021-10-08

## 2020-01-07 MED ORDER — ALBUTEROL SULFATE HFA 108 (90 BASE) MCG/ACT IN AERS
8.0000 | INHALATION_SPRAY | Freq: Once | RESPIRATORY_TRACT | Status: AC
  Administered 2020-01-07: 8 via RESPIRATORY_TRACT
  Filled 2020-01-07: qty 6.7

## 2020-01-07 MED ORDER — IOHEXOL 300 MG/ML  SOLN
100.0000 mL | Freq: Once | INTRAMUSCULAR | Status: AC | PRN
  Administered 2020-01-07: 100 mL via INTRAVENOUS

## 2020-01-07 NOTE — ED Notes (Signed)
Patient transported to X-ray 

## 2020-01-07 NOTE — Discharge Instructions (Addendum)
Thank you for allowing me to care for you today in the Emergency Department.   Your home pain medication can make you constipated.  It is also important that you drink at least 64 ounces of water daily.  Try to increase the amount of fiber in your diet.  Information on fiber content in foods is attached.  MiraLAX is available over-the-counter. Entire 238 g bottle of MiraLAX in 64 ounces of a sports drink. Drink the entire mixture over the next few hours until it is fully gone.  Lindsay Joyce, you can have an 8 ounce glass every 15 to 30 minutes, which means that you should complete the drink in 2 to 4 hours.  It is very important that you drink lots of water and other liquids in order to avoid dehydration flush of your bowel.  Please avoid alcohol as it can make you dehydrated.  Make sure to stay home for several hours once you have started your cleanout.  You can also try using moist tablets or wipes to help minimize discomfort during the cleanout.  Use caution with flushing them down the toilet even if they say they are flushable.  You may have some associated nausea and bloating with this regimen.  After this regimen, you can use 1 dose of MiraLAX daily until you are having soft, regular bowel movements.  You can then decrease this amount by half.  You can also take 1 to 2 tablets of bisacodyl daily for constipation if using MiraLAX alone does not help your symptoms.  Please follow closely with your primary care provider.  Return to the emergency department if you stop having bowel movements and stop passing gas, if your abdominal pain becomes uncontrollable, if you start having fevers, uncontrollable vomiting, or other new, concerning symptoms.
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
## 2020-01-08 LAB — URINE CULTURE: Culture: >=100000 — AB

## 2020-01-09 ENCOUNTER — Telehealth: Payer: Self-pay | Admitting: *Deleted

## 2020-01-09 NOTE — Telephone Encounter (Signed)
Post ED Visit - Positive Culture Follow-up  Culture report reviewed by antimicrobial stewardship pharmacist: [facility] []  Nathan Batchelder, Pharm.D. []  , Pharm.D., BCPS AQ-ID []  , Pharm.D., BCPS []  Celedonio Miyamoto, Pharm.D., BCPS []  [place], Garvin Fila.D., BCPS, AAHIVP []  , Pharm.D., BCPS, AAHIVP []  Georgina Pillion, PharmD, BCPS []  , PharmD, BCPS []  [place], PharmD, BCPS []  [address], PharmD []  , PharmD, BCPS []  Estella Husk, PharmD  Pharmacy Team []  Lysle Pearl, PharmD []  , PharmD []  Phillips Climes, PharmD []  , Rph []  Agapito Games) , PharmD []  Verlan Friends, PharmD []  , PharmD []  Mervyn Gay, PharmD []  , PharmD []  Vinnie Level, PharmD []  [facility], PharmD []  , PharmD []  Len Childs, PharmD   Positive urine culture No antibiotics needed and no further patient follow-up is required at this time. , PhamrD  Greer Pickerel Talley 01/09/2020, 10:32 AM

## 2020-06-16 ENCOUNTER — Emergency Department (HOSPITAL_COMMUNITY)
Admission: EM | Admit: 2020-06-16 | Discharge: 2020-06-17 | Disposition: A | Discharge status: Home / Self Care | Payer: Self-pay | Attending: Emergency Medicine | Admitting: Emergency Medicine

## 2020-06-16 ENCOUNTER — Emergency Department (HOSPITAL_COMMUNITY): Payer: Self-pay

## 2020-06-16 ENCOUNTER — Encounter (HOSPITAL_COMMUNITY): Payer: Self-pay

## 2020-06-16 ENCOUNTER — Other Ambulatory Visit: Payer: Self-pay

## 2020-06-16 DIAGNOSIS — R053 Chronic cough: Secondary | ICD-10-CM | POA: Insufficient documentation

## 2020-06-16 DIAGNOSIS — F17210 Nicotine dependence, cigarettes, uncomplicated: Secondary | ICD-10-CM | POA: Insufficient documentation

## 2020-06-16 DIAGNOSIS — R062 Wheezing: Secondary | ICD-10-CM | POA: Insufficient documentation

## 2020-06-16 DIAGNOSIS — S61452A Open bite of left hand, initial encounter: Secondary | ICD-10-CM | POA: Insufficient documentation

## 2020-06-16 DIAGNOSIS — N644 Mastodynia: Secondary | ICD-10-CM | POA: Insufficient documentation

## 2020-06-16 DIAGNOSIS — N3000 Acute cystitis without hematuria: Secondary | ICD-10-CM | POA: Insufficient documentation

## 2020-06-16 DIAGNOSIS — Z7982 Long term (current) use of aspirin: Secondary | ICD-10-CM | POA: Insufficient documentation

## 2020-06-16 DIAGNOSIS — W540XXA Bitten by dog, initial encounter: Secondary | ICD-10-CM | POA: Insufficient documentation

## 2020-06-16 DIAGNOSIS — R079 Chest pain, unspecified: Secondary | ICD-10-CM

## 2020-06-16 LAB — CBC
HCT: 46.9 % — ABNORMAL HIGH (ref 36.0–46.0)
Hemoglobin: 15.4 g/dL — ABNORMAL HIGH (ref 12.0–15.0)
MCH: 29.7 pg (ref 26.0–34.0)
MCHC: 32.8 g/dL (ref 30.0–36.0)
MCV: 90.4 fL (ref 80.0–100.0)
Platelets: 386 10*3/uL (ref 150–400)
RBC: 5.19 MIL/uL — ABNORMAL HIGH (ref 3.87–5.11)
RDW: 14.1 % (ref 11.5–15.5)
WBC: 10.8 10*3/uL — ABNORMAL HIGH (ref 4.0–10.5)
nRBC: 0 % (ref 0.0–0.2)

## 2020-06-16 LAB — BASIC METABOLIC PANEL
Anion gap: 9 (ref 5–15)
BUN: 12 mg/dL (ref 6–20)
CO2: 25 mmol/L (ref 22–32)
Calcium: 8.9 mg/dL (ref 8.9–10.3)
Chloride: 106 mmol/L (ref 98–111)
Creatinine, Ser: 1.04 mg/dL — ABNORMAL HIGH (ref 0.44–1.00)
GFR, Estimated: >60 mL/min (ref >60)
Glucose, Bld: 102 mg/dL — ABNORMAL HIGH (ref 70–99)
Potassium: 3.9 mmol/L (ref 3.5–5.1)
Sodium: 140 mmol/L (ref 135–145)

## 2020-06-16 LAB — TROPONIN I (HIGH SENSITIVITY)
Troponin I (High Sensitivity): 2 ng/L (ref <18)
Troponin I (High Sensitivity): <2 ng/L (ref <18)

## 2020-06-16 LAB — I-STAT BETA HCG BLOOD, ED (MC, WL, AP ONLY): I-stat hCG, quantitative: <5 m[IU]/mL (ref <5)

## 2020-06-16 MED ORDER — IPRATROPIUM BROMIDE 0.02 % IN SOLN
0.5000 mg | Freq: Once | RESPIRATORY_TRACT | Status: AC
  Administered 2020-06-16: 0.5 mg via RESPIRATORY_TRACT
  Filled 2020-06-16: qty 2.5

## 2020-06-16 MED ORDER — ALBUTEROL SULFATE (2.5 MG/3ML) 0.083% IN NEBU
5.0000 mg | INHALATION_SOLUTION | Freq: Once | RESPIRATORY_TRACT | Status: AC
  Administered 2020-06-16: 5 mg via RESPIRATORY_TRACT
  Filled 2020-06-16: qty 6

## 2020-06-16 MED ORDER — PREDNISONE 20 MG PO TABS
60.0000 mg | ORAL_TABLET | Freq: Once | ORAL | Status: AC
  Administered 2020-06-16: 60 mg via ORAL
  Filled 2020-06-16: qty 3

## 2020-06-16 NOTE — ED Triage Notes (Signed)
Pt c/o midline chest pain radiating around L breat into back x days, states it is worse with movement. States she has had some relief with taking her prescribed xanax

## 2020-06-16 NOTE — ED Triage Notes (Signed)
Emergency Medicine Provider Triage Evaluation Note  Lindsay Joyce , a 60 y.o. female  was evaluated in triage.  Pt complains of chest pain lower left side under the breast x 4 days.Better with rest and xanax. Exacerbated with movement.   Review of Systems  Positive: Chest pain, shortness of breath, abdominal pain,cough  Negative: Fever, nausea  Physical Exam  BP (!) 155/101   Pulse 93   Temp 98.6 F (37 C)   Resp 16   Ht 5\' 2"  (1.575 m)   Wt 59.9 kg   LMP 03/05/2008   SpO2 98%   BMI 24.14 kg/m  Gen:   Awake, no distress  HEENT:  Atraumatic  Resp:  Normal effort  Cardiac:  Normal rate  Abd:   Nondistended, nontender  MSK:   Moves extremities without difficulty Neuro:  Speech clear   Medical Decision Making  Medically screening exam initiated at 7:33 PM.  Appropriate orders placed.  Lindsay Joyce was informed that the remainder of the evaluation will be completed by another provider, this initial triage assessment does not replace that evaluation, and the importance of remaining in the ED until their evaluation is complete.  Clinical Impression  Patient here with chest pain x 3 days. Alleviated with xanax. No prior HX of CAD or family hx of CAD. Does smoke cigarettes.  Labs and chest xray ordered.    [name], PA-C 06/16/20 1935

## 2020-06-16 NOTE — ED Notes (Signed)
Breathing treatments being administered
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
## 2020-06-17 LAB — URINALYSIS, ROUTINE W REFLEX MICROSCOPIC
Bilirubin Urine: NEGATIVE
Glucose, UA: NEGATIVE mg/dL
Hgb urine dipstick: NEGATIVE
Ketones, ur: 5 mg/dL — AB
Nitrite: POSITIVE — AB
Protein, ur: NEGATIVE mg/dL
Specific Gravity, Urine: 1.018 (ref 1.005–1.030)
WBC, UA: >50 WBC/hpf — ABNORMAL HIGH (ref 0–5)
pH: 6 (ref 5.0–8.0)

## 2020-06-17 MED ORDER — PREDNISONE 20 MG PO TABS: ORAL_TABLET | ORAL | 0 refills | Status: DC

## 2020-06-17 MED ORDER — ALBUTEROL SULFATE HFA 108 (90 BASE) MCG/ACT IN AERS: 2.0000 | INHALATION_SPRAY | RESPIRATORY_TRACT | 0 refills | Status: AC | PRN

## 2020-06-17 MED ORDER — ALBUTEROL SULFATE (2.5 MG/3ML) 0.083% IN NEBU
5.0000 mg | INHALATION_SOLUTION | Freq: Once | RESPIRATORY_TRACT | Status: AC
  Administered 2020-06-17: 5 mg via RESPIRATORY_TRACT
  Filled 2020-06-17: qty 6

## 2020-06-17 MED ORDER — AMOXICILLIN-POT CLAVULANATE 875-125 MG PO TABS: 1.0000 | ORAL_TABLET | Freq: Two times a day (BID) | ORAL | 0 refills | Status: DC

## 2020-06-17 NOTE — ED Notes (Signed)
Pt taken off oxygen again and oxygen dropped to 87%. Third trial without oxygen

## 2020-06-17 NOTE — ED Notes (Signed)
Pt ambulated to bathroom without oxygen, pt placed on pulse ox after using the restroom and oxygen remained 87%

## 2020-06-17 NOTE — Discharge Instructions (Signed)
Take the prescribed medication as directed. Keep wound on hand clean and dry. Follow-up with your primary care doctor within the next week for re-check. Return to the ED for new or worsening symptoms.

## 2020-06-17 NOTE — ED Notes (Signed)
Pt placed on 2L [place] due to oxygen saturation at 87% on room air. When pt performs deep breathing, oxygen goes to 90%, but then drops back to 87-89% when deep breathing stops

## 2020-06-17 NOTE — ED Notes (Signed)
Pt on room air speaking with provider. Pt oxygen saturation is 92%

## 2021-01-07 DIAGNOSIS — R69 Illness, unspecified: Secondary | ICD-10-CM | POA: Diagnosis not present

## 2021-01-07 DIAGNOSIS — R5383 Other fatigue: Secondary | ICD-10-CM | POA: Diagnosis not present

## 2021-01-07 DIAGNOSIS — G894 Chronic pain syndrome: Secondary | ICD-10-CM | POA: Diagnosis not present

## 2021-01-07 DIAGNOSIS — M797 Fibromyalgia: Secondary | ICD-10-CM | POA: Diagnosis not present

## 2021-01-07 DIAGNOSIS — M159 Polyosteoarthritis, unspecified: Secondary | ICD-10-CM | POA: Diagnosis not present

## 2021-01-07 DIAGNOSIS — R5381 Other malaise: Secondary | ICD-10-CM | POA: Diagnosis not present

## 2021-10-08 ENCOUNTER — Other Ambulatory Visit: Payer: Self-pay

## 2021-10-08 ENCOUNTER — Inpatient Hospital Stay (HOSPITAL_COMMUNITY)
Admission: EM | Admit: 2021-10-08 | Discharge: 2021-10-10 | DRG: 189 | Disposition: A | Discharge status: Home / Self Care | Payer: Self-pay | Attending: Internal Medicine | Admitting: Internal Medicine

## 2021-10-08 ENCOUNTER — Emergency Department (HOSPITAL_COMMUNITY): Payer: Self-pay

## 2021-10-08 ENCOUNTER — Encounter (HOSPITAL_COMMUNITY): Payer: Self-pay

## 2021-10-08 DIAGNOSIS — R64 Cachexia: Secondary | ICD-10-CM | POA: Diagnosis present

## 2021-10-08 DIAGNOSIS — F17210 Nicotine dependence, cigarettes, uncomplicated: Secondary | ICD-10-CM | POA: Diagnosis present

## 2021-10-08 DIAGNOSIS — F17200 Nicotine dependence, unspecified, uncomplicated: Secondary | ICD-10-CM | POA: Diagnosis present

## 2021-10-08 DIAGNOSIS — J9601 Acute respiratory failure with hypoxia: Principal | ICD-10-CM | POA: Diagnosis present

## 2021-10-08 DIAGNOSIS — Z79899 Other long term (current) drug therapy: Secondary | ICD-10-CM

## 2021-10-08 DIAGNOSIS — J441 Chronic obstructive pulmonary disease with (acute) exacerbation: Principal | ICD-10-CM

## 2021-10-08 DIAGNOSIS — E039 Hypothyroidism, unspecified: Secondary | ICD-10-CM | POA: Diagnosis present

## 2021-10-08 DIAGNOSIS — F418 Other specified anxiety disorders: Secondary | ICD-10-CM | POA: Diagnosis present

## 2021-10-08 DIAGNOSIS — Z7151 Drug abuse counseling and surveillance of drug abuser: Secondary | ICD-10-CM

## 2021-10-08 DIAGNOSIS — M546 Pain in thoracic spine: Secondary | ICD-10-CM | POA: Diagnosis present

## 2021-10-08 DIAGNOSIS — Z20822 Contact with and (suspected) exposure to covid-19: Secondary | ICD-10-CM | POA: Diagnosis present

## 2021-10-08 DIAGNOSIS — D72829 Elevated white blood cell count, unspecified: Secondary | ICD-10-CM

## 2021-10-08 DIAGNOSIS — Z885 Allergy status to narcotic agent status: Secondary | ICD-10-CM

## 2021-10-08 DIAGNOSIS — Z8041 Family history of malignant neoplasm of ovary: Secondary | ICD-10-CM

## 2021-10-08 DIAGNOSIS — F1220 Cannabis dependence, uncomplicated: Secondary | ICD-10-CM | POA: Diagnosis present

## 2021-10-08 DIAGNOSIS — Z8249 Family history of ischemic heart disease and other diseases of the circulatory system: Secondary | ICD-10-CM

## 2021-10-08 DIAGNOSIS — Z9049 Acquired absence of other specified parts of digestive tract: Secondary | ICD-10-CM

## 2021-10-08 DIAGNOSIS — Z6823 Body mass index (BMI) 23.0-23.9, adult: Secondary | ICD-10-CM

## 2021-10-08 DIAGNOSIS — F419 Anxiety disorder, unspecified: Secondary | ICD-10-CM | POA: Diagnosis present

## 2021-10-08 DIAGNOSIS — G8929 Other chronic pain: Secondary | ICD-10-CM | POA: Diagnosis present

## 2021-10-08 DIAGNOSIS — N3010 Interstitial cystitis (chronic) without hematuria: Secondary | ICD-10-CM | POA: Diagnosis present

## 2021-10-08 DIAGNOSIS — Z720 Tobacco use: Secondary | ICD-10-CM | POA: Diagnosis present

## 2021-10-08 DIAGNOSIS — Z66 Do not resuscitate: Secondary | ICD-10-CM | POA: Diagnosis present

## 2021-10-08 DIAGNOSIS — Z79890 Hormone replacement therapy (postmenopausal): Secondary | ICD-10-CM

## 2021-10-08 DIAGNOSIS — Z8673 Personal history of transient ischemic attack (TIA), and cerebral infarction without residual deficits: Secondary | ICD-10-CM

## 2021-10-08 DIAGNOSIS — Z888 Allergy status to other drugs, medicaments and biological substances status: Secondary | ICD-10-CM

## 2021-10-08 DIAGNOSIS — F32A Depression, unspecified: Secondary | ICD-10-CM | POA: Diagnosis present

## 2021-10-08 LAB — CBC
HCT: 44.3 % (ref 36.0–46.0)
Hemoglobin: 14.6 g/dL (ref 12.0–15.0)
MCH: 29.2 pg (ref 26.0–34.0)
MCHC: 33 g/dL (ref 30.0–36.0)
MCV: 88.6 fL (ref 80.0–100.0)
Platelets: 358 10*3/uL (ref 150–400)
RBC: 5 MIL/uL (ref 3.87–5.11)
RDW: 13.8 % (ref 11.5–15.5)
WBC: 11.4 10*3/uL — ABNORMAL HIGH (ref 4.0–10.5)
nRBC: 0 % (ref 0.0–0.2)

## 2021-10-08 LAB — BASIC METABOLIC PANEL
Anion gap: 11 (ref 5–15)
BUN: 15 mg/dL (ref 6–20)
CO2: 25 mmol/L (ref 22–32)
Calcium: 9 mg/dL (ref 8.9–10.3)
Chloride: 103 mmol/L (ref 98–111)
Creatinine, Ser: 0.98 mg/dL (ref 0.44–1.00)
GFR, Estimated: >60 mL/min (ref >60)
Glucose, Bld: 101 mg/dL — ABNORMAL HIGH (ref 70–99)
Potassium: 3.5 mmol/L (ref 3.5–5.1)
Sodium: 139 mmol/L (ref 135–145)

## 2021-10-08 LAB — I-STAT BETA HCG BLOOD, ED (MC, WL, AP ONLY): I-stat hCG, quantitative: <5 m[IU]/mL (ref <5)

## 2021-10-08 LAB — TROPONIN I (HIGH SENSITIVITY)
Troponin I (High Sensitivity): 2 ng/L (ref <18)
Troponin I (High Sensitivity): <2 ng/L (ref <18)

## 2021-10-08 LAB — D-DIMER, QUANTITATIVE: D-Dimer, Quant: <0.27 ug/mL-FEU (ref 0.00–0.50)

## 2021-10-08 MED ORDER — VITAMIN D 25 MCG (1000 UNIT) PO TABS
2000.0000 [IU] | ORAL_TABLET | Freq: Every day | ORAL | Status: DC
  Administered 2021-10-08 – 2021-10-10 (×3): 2000 [IU] via ORAL
  Filled 2021-10-08 (×3): qty 2

## 2021-10-08 MED ORDER — ONDANSETRON HCL 4 MG/2ML IJ SOLN: 4.0000 mg | Freq: Four times a day (QID) | INTRAMUSCULAR | Status: DC | PRN

## 2021-10-08 MED ORDER — IPRATROPIUM-ALBUTEROL 0.5-2.5 (3) MG/3ML IN SOLN
3.0000 mL | RESPIRATORY_TRACT | Status: DC
  Administered 2021-10-08: 3 mL via RESPIRATORY_TRACT
  Filled 2021-10-08 (×2): qty 3

## 2021-10-08 MED ORDER — SERTRALINE HCL 50 MG PO TABS
50.0000 mg | ORAL_TABLET | Freq: Two times a day (BID) | ORAL | Status: DC
  Administered 2021-10-08 – 2021-10-10 (×5): 50 mg via ORAL
  Filled 2021-10-08 (×5): qty 1

## 2021-10-08 MED ORDER — ACETAMINOPHEN 325 MG PO TABS: 650.0000 mg | ORAL_TABLET | Freq: Four times a day (QID) | ORAL | Status: DC | PRN

## 2021-10-08 MED ORDER — IPRATROPIUM-ALBUTEROL 0.5-2.5 (3) MG/3ML IN SOLN
3.0000 mL | Freq: Once | RESPIRATORY_TRACT | Status: DC
  Filled 2021-10-08: qty 3

## 2021-10-08 MED ORDER — IPRATROPIUM-ALBUTEROL 0.5-2.5 (3) MG/3ML IN SOLN
3.0000 mL | Freq: Three times a day (TID) | RESPIRATORY_TRACT | Status: DC
  Administered 2021-10-09: 3 mL via RESPIRATORY_TRACT
  Filled 2021-10-08: qty 3

## 2021-10-08 MED ORDER — ADULT MULTIVITAMIN W/MINERALS CH
1.0000 | ORAL_TABLET | Freq: Every day | ORAL | Status: DC
  Administered 2021-10-08 – 2021-10-10 (×3): 1 via ORAL
  Filled 2021-10-08 (×3): qty 1

## 2021-10-08 MED ORDER — VITAMIN D3 50 MCG (2000 UT) PO CAPS: 2000.0000 [IU] | ORAL_CAPSULE | Freq: Every day | ORAL | Status: DC

## 2021-10-08 MED ORDER — OXYCODONE-ACETAMINOPHEN 7.5-325 MG PO TABS
1.0000 | ORAL_TABLET | Freq: Two times a day (BID) | ORAL | Status: DC | PRN
  Administered 2021-10-08 – 2021-10-09 (×2): 1 via ORAL
  Filled 2021-10-08 (×2): qty 1

## 2021-10-08 MED ORDER — ONDANSETRON HCL 4 MG PO TABS: 4.0000 mg | ORAL_TABLET | Freq: Four times a day (QID) | ORAL | Status: DC | PRN

## 2021-10-08 MED ORDER — ENOXAPARIN SODIUM 40 MG/0.4ML IJ SOSY
40.0000 mg | PREFILLED_SYRINGE | INTRAMUSCULAR | Status: DC
  Administered 2021-10-08 – 2021-10-10 (×3): 40 mg via SUBCUTANEOUS
  Filled 2021-10-08 (×3): qty 0.4

## 2021-10-08 MED ORDER — PREDNISONE 20 MG PO TABS: 40.0000 mg | ORAL_TABLET | Freq: Every day | ORAL | Status: DC

## 2021-10-08 MED ORDER — IPRATROPIUM-ALBUTEROL 0.5-2.5 (3) MG/3ML IN SOLN
3.0000 mL | Freq: Four times a day (QID) | RESPIRATORY_TRACT | Status: DC
  Administered 2021-10-08 (×2): 3 mL via RESPIRATORY_TRACT
  Filled 2021-10-08 (×2): qty 3

## 2021-10-08 MED ORDER — METHYLPREDNISOLONE SODIUM SUCC 125 MG IJ SOLR
125.0000 mg | Freq: Once | INTRAMUSCULAR | Status: AC
  Administered 2021-10-08: 125 mg via INTRAVENOUS
  Filled 2021-10-08: qty 2

## 2021-10-08 MED ORDER — MULTIVITAMINS PO CAPS: 1.0000 | ORAL_CAPSULE | Freq: Every day | ORAL | Status: DC

## 2021-10-08 MED ORDER — ACETAMINOPHEN 650 MG RE SUPP: 650.0000 mg | Freq: Four times a day (QID) | RECTAL | Status: DC | PRN

## 2021-10-08 MED ORDER — METHYLPREDNISOLONE SODIUM SUCC 40 MG IJ SOLR
40.0000 mg | Freq: Two times a day (BID) | INTRAMUSCULAR | Status: AC
  Administered 2021-10-08 – 2021-10-09 (×2): 40 mg via INTRAVENOUS
  Filled 2021-10-08 (×2): qty 1

## 2021-10-08 MED ORDER — IPRATROPIUM-ALBUTEROL 0.5-2.5 (3) MG/3ML IN SOLN
3.0000 mL | Freq: Once | RESPIRATORY_TRACT | Status: AC
  Administered 2021-10-08: 3 mL via RESPIRATORY_TRACT
  Filled 2021-10-08: qty 3

## 2021-10-08 MED ORDER — ALPRAZOLAM 1 MG PO TABS
1.0000 mg | ORAL_TABLET | Freq: Three times a day (TID) | ORAL | Status: DC
  Administered 2021-10-08 – 2021-10-10 (×7): 2 mg via ORAL
  Filled 2021-10-08 (×7): qty 2

## 2021-10-08 NOTE — H&P (Signed)
History and Physical    Patient: Lindsay Joyce SAY:[id number] DOB: 1961-02-16 DOA: 10/08/2021 DOS: the patient was seen and examined on 10/08/2021 PCP: Barbie Banner, MD  Patient coming from: Home  Chief Complaint:  Chief Complaint  Patient presents with   Shortness of Breath   HPI: DESSIREE SZE is a 61 y.o. female with medical history significant of tobacco abuse, anxiety/depression, chronic pain. Presenting with shortness of breath. Her symptoms started a week ago. She has not have productive cough or fever. She has not had sick contacts. She continues to smoke tobacco and marijuana daily. She tried breathing treatments at home, but they did not help. She denies a formal diagnosis of COPD. When her symptoms did not improve last night, she decided to come to the ED for assistance. She denies any other aggravating or alleviating factors.   Review of Systems: As mentioned in the history of present illness. All other systems reviewed and are negative. Past Medical History:  Diagnosis Date   Anxiety    Chronic interstitial cystitis    Depression    Dog bite of hand, left, initial encounter 08/23/2019   Shortness of breath    Smoker    Stroke [facility])    questionable hx-years ago-no documentation   Past Surgical History:  Procedure Laterality Date   CHOLECYSTECTOMY  2008   CYSTO WITH HYDRODISTENSION N/A 03/12/2013   Procedure: CYSTOSCOPY/HYDRODISTENSION/INSTILLATION OF MARCAINE AND PYRIDIUM;  Surgeon: Bjorn Pippin, MD;  Location: [facility];  Service: Urology;  Laterality: N/A;   CYSTOSCOPY WITH URETHRAL DILATATION N/A 03/12/2013   Procedure: CYSTOSCOPY WITH URETHRAL DILATATION;  Surgeon: Bjorn Pippin, MD;  Location: [facility];  Service: Urology;  Laterality: N/A;   I & D EXTREMITY Left 08/23/2019   Procedure: IRRIGATION AND DEBRIDEMENT EXTREMITY;  Surgeon: Dominica Severin, MD;  Location: [facility] OR;  Service: Orthopedics;  Laterality: Left;   LAVH,BSO  12/09    URETHRAL DILATION/HOD  2008   X2   Social History:  reports that she has been smoking cigarettes. She has a 46.00 pack-year smoking history. She has never used smokeless tobacco. She reports current drug use. Drug: Marijuana. She reports that she does not drink alcohol.  Allergies  Allergen Reactions   Codeine     Other reaction(s): GI Upset (intolerance)   Diclofenac Sodium Nausea And Vomiting   Gabapentin     Other reaction(s): GI Upset (intolerance)   Hydrocodone-Acetaminophen     Other reaction(s): GI Upset (intolerance)   Hydromorphone     Other reaction(s): GI Upset (intolerance)   Meloxicam Other (See Comments)    Upset stomach   Oxymorphone Itching   Risperidone Other (See Comments)    "Made nose itch really bad"   Tramadol Other (See Comments)    Headache, Suicidal thoughts   Morphine And Related Anxiety    "It tore up my nerves."    Family History  Problem Relation Age of Onset   Heart disease Mother    Cancer Mother        CERVICAL   Hypertension Mother        DECEASED   Ovarian cancer Mother    Cancer Father        PROSTATE   Hypertension Father        DECEASED    Prior to Admission medications   Medication Sig Start Date End Date Taking? Authorizing Provider  albuterol (VENTOLIN HFA) 108 (90 Base) MCG/ACT inhaler Inhale 2 puffs into the lungs every  4 (four) hours as needed for wheezing or shortness of breath. 06/17/20  Yes Garlon Hatchet, PA-C  ALPRAZolam Prudy Feeler) 1 MG tablet Take 1-2 tablets by mouth 3 (three) times daily. 05/18/15  Yes [provider]  B Complex-C (B-COMPLEX WITH VITAMIN C) tablet Take 1 tablet by mouth daily.   Yes [provider]  Cholecalciferol (VITAMIN D3) 2000 units capsule Take 2,000 Units by mouth daily.   Yes [provider]  Multiple Vitamin (MULTIVITAMIN) capsule Take 1 capsule by mouth daily.   Yes [provider]  oxyCODONE-acetaminophen (PERCOCET) 7.5-325 MG tablet Take 1 tablet by mouth 2  (two) times daily as needed for moderate pain. 10/06/21  Yes [provider]  sertraline (ZOLOFT) 100 MG tablet Take 50 mg by mouth 2 (two) times daily.   Yes [provider]  aspirin EC 81 MG tablet Take 325 mg by mouth daily. Patient not taking: Reported on 10/08/2021    [provider]  levothyroxine (SYNTHROID) 25 MCG tablet Take 25 mcg by mouth daily. Patient not taking: Reported on 10/08/2021 07/23/21   [provider]    Physical Exam: Vitals:   10/08/21 0154 10/08/21 0200 10/08/21 0310 10/08/21 0442  BP:  (!) 134/116 133/81 123/75  Pulse:  96 73 66  Resp:  18 14 18   Temp:    98.9 F (37.2 C)  TempSrc:    Oral  SpO2:  90% 95% 100%  Weight: 59 kg     Height: 5\' 2"  (1.575 m)      General: 62 y.o. female resting in bed in NAD Eyes: PERRL, normal sclera ENMT: Nares patent w/o discharge, orophaynx clear, dentition normal, ears w/o discharge/lesions/ulcers Neck: Supple, trachea midline Cardiovascular: RRR, +S1, S2, no m/g/r, equal pulses throughout Respiratory: diffuse expiratory wheeze, slightly increased WOB on 3L [place] GI: BS+, NDNT, no masses noted, no organomegaly noted MSK: No e/c/c Skin: No rashes, bruises, ulcerations noted Neuro: A&O x 3, no focal deficits Psyc: Appropriate interaction and affect, calm/cooperative   Data Reviewed:  Lab Results  Component Value Date   NA 139 10/08/2021   K 3.5 10/08/2021   CO2 25 10/08/2021   GLUCOSE 101 (H) 10/08/2021   BUN 15 10/08/2021   CREATININE 0.98 10/08/2021   CALCIUM 9.0 10/08/2021   GFRNONAA >60 10/08/2021   Lab Results  Component Value Date   WBC 11.4 (H) 10/08/2021   HGB 14.6 10/08/2021   HCT 44.3 10/08/2021   MCV 88.6 10/08/2021   PLT 358 10/08/2021   Trp 2 -> <2  EKG: sinus, no st elevations  CXR: No active cardiopulmonary disease.  Assessment and Plan: Acute respiratory failure with hypoxia     - admitted to inpt, tele     - she hasn't previous diagnosis of COPD, but  this, clinically, is a COPD exacerbation     - nebs, steroids, guaifenesin     - wean O2 as able     - checking d-dimer as well  Tobacco abuse Marijuana abuse     - counseled against further use     - she has declined a nicotine patch  Leukocytosis     - mild, no fever, CXR is clear, likely reactive; follow  Anxiety Depression     - continue home regimen  Social:     - she doesn't have any insurance, and she will likely need nebs long-term     - will ask TOC to see for med assistance  Advance Care Planning:  Code Status: DNR, confirmed through multiple lines of questioning.   Consults: None  Family Communication: None at bedside  Severity of Illness: The appropriate patient status for this patient is INPATIENT. Inpatient status is judged to be reasonable and necessary in order to provide the required intensity of service to ensure the patient's safety. The patient's presenting symptoms, physical exam findings, and initial radiographic and laboratory data in the context of their chronic comorbidities is felt to place them at high risk for further clinical deterioration. Furthermore, it is not anticipated that the patient will be medically stable for discharge from the hospital within 2 midnights of admission.   * I certify that at the point of admission it is my clinical judgment that the patient will require inpatient hospital care spanning beyond 2 midnights from the point of admission due to high intensity of service, high risk for further deterioration and high frequency of surveillance required.*  Author: Teddy Spike, DO 10/08/2021 7:12 AM  For on call review [email or web address].
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
## 2021-10-08 NOTE — ED Triage Notes (Addendum)
Reports shob x 4 months but has been getting worse. Began having severe left shoulder/ back pain.   Today pt noticed her sats dropped to 77% and was having difficulty thinking.   No supplemental o2 usage.

## 2021-10-08 NOTE — ED Provider Notes (Signed)
[facility]-EMERGENCY DEPT [facility] Emergency Department Provider Note MRN:  [id number]  Arrival date & time: 10/08/21     Chief Complaint   Shortness of Breath   History of Present Illness   Lindsay Joyce is a 61 y.o. year-old female with a history of tobacco use presenting to the ED with chief complaint of shortness of breath.  Worsening shortness of breath over the past several days.  Cough.  No fever.  No chest pain.  Saw that her oxygen numbers were 77% at home, here for evaluation.  Review of Systems  A thorough review of systems was obtained and all systems are negative except as noted in the HPI and PMH.   Patient's Health History    Past Medical History:  Diagnosis Date   Anxiety    Chronic interstitial cystitis    Depression    Dog bite of hand, left, initial encounter 08/23/2019   Shortness of breath    Smoker    Stroke [facility])    questionable hx-years ago-no documentation    Past Surgical History:  Procedure Laterality Date   CHOLECYSTECTOMY  2008   CYSTO WITH HYDRODISTENSION N/A 03/12/2013   Procedure: CYSTOSCOPY/HYDRODISTENSION/INSTILLATION OF MARCAINE AND PYRIDIUM;  Surgeon: Bjorn Pippin, MD;  Location: [facility];  Service: Urology;  Laterality: N/A;   CYSTOSCOPY WITH URETHRAL DILATATION N/A 03/12/2013   Procedure: CYSTOSCOPY WITH URETHRAL DILATATION;  Surgeon: Bjorn Pippin, MD;  Location: [facility];  Service: Urology;  Laterality: N/A;   I & D EXTREMITY Left 08/23/2019   Procedure: IRRIGATION AND DEBRIDEMENT EXTREMITY;  Surgeon: Dominica Severin, MD;  Location: [facility];  Service: Orthopedics;  Laterality: Left;   LAVH,BSO  12/09   URETHRAL DILATION/HOD  2008   X2    Family History  Problem Relation Age of Onset   Heart disease Mother    Cancer Mother        CERVICAL   Hypertension Mother        DECEASED   Ovarian cancer Mother    Cancer Father        PROSTATE   Hypertension Father        DECEASED    Social History    Socioeconomic History   Marital status: Married    Spouse name: Not on file   Number of children: Not on file   Years of education: Not on file   Highest education level: Not on file  Occupational History   Not on file  Tobacco Use   Smoking status: Every Day    Packs/day: 1.00    Years: 46.00    Total pack years: 46.00    Types: Cigarettes   Smokeless tobacco: Never  Substance and Sexual Activity   Alcohol use: No   Drug use: Yes    Types: Marijuana    Comment: daily/prn   Sexual activity: Not Currently  Other Topics Concern   Not on file  Social History Narrative   Not on file   Social Determinants of Health   Financial Resource Strain: Not on file  Food Insecurity: Not on file  Transportation Needs: Not on file  Physical Activity: Not on file  Stress: Not on file  Social Connections: Not on file  Intimate Partner Violence: Not on file     Physical Exam   Vitals:   10/08/21 0200 10/08/21 0310  BP: (!) 134/116 133/81  Pulse: 96 73  Resp: 18 14  Temp:    SpO2: 90% 95%  CONSTITUTIONAL: Chronically ill-appearing, NAD NEURO/PSYCH:  Alert and oriented x 3, no focal deficits EYES:  eyes equal and reactive ENT/NECK:  no LAD, no JVD CARDIO: Regular rate, well-perfused, normal S1 and S2 PULM: Diffuse wheezing GI/GU:  non-distended, non-tender MSK/SPINE:  No gross deformities, no edema SKIN:  no rash, atraumatic   *Additional and/or pertinent findings included in MDM below  Diagnostic and Interventional Summary    EKG Interpretation  Date/Time:  Friday October 08 2021 01:52:14 EDT Ventricular Rate:  80 PR Interval:  162 QRS Duration: 89 QT Interval:  356 QTC Calculation: 411 R Axis:   34 Text Interpretation: Sinus rhythm LAE, consider biatrial enlargement Anteroseptal infarct, age indeterminate Confirmed by Kennis Carina [phone]) on 10/08/2021 3:08:06 AM       Labs Reviewed  BASIC METABOLIC PANEL - Abnormal; Notable for the following components:       Result Value   Glucose, Bld 101 (*)    All other components within normal limits  CBC - Abnormal; Notable for the following components:   WBC 11.4 (*)    All other components within normal limits  I-STAT BETA HCG BLOOD, ED ([facility], [facility], [facility] ONLY)  TROPONIN I (HIGH SENSITIVITY)  TROPONIN I (HIGH SENSITIVITY)    DG Chest 2 View  Final Result      Medications  methylPREDNISolone sodium succinate (SOLU-MEDROL) 125 mg/2 mL injection 125 mg (has no administration in time range)  ipratropium-albuterol (DUONEB) 0.5-2.5 (3) MG/3ML nebulizer solution 3 mL (0 mLs Nebulization Hold 10/08/21 0329)  ipratropium-albuterol (DUONEB) 0.5-2.5 (3) MG/3ML nebulizer solution 3 mL (3 mLs Nebulization Given 10/08/21 0329)     Procedures  /  Critical Care Procedures  ED Course and Medical Decision Making  Initial Impression and Ddx Diffuse wheezing, shortness of breath, lung tobacco use history.  Suspect COPD though she does not have a definitive diagnosis.  Vital signs are reassuring, she is newly hypoxic on 2 L to maintain her saturation at 90%.  Doubt PE, doubt ACS.  Will request hospitalist admission.  Past medical/surgical history that increases complexity of ED encounter: Long tobacco use, she is trying to quit  Interpretation of Diagnostics I personally reviewed the EKG and my interpretation is as follows: Sinus rhythm  Labs reassuring with no significant blood count or electrolyte disturbance, troponin negative, chest x-ray without obvious opacity or pneumothorax  Patient Reassessment and Ultimate Disposition/Management     Admission to hospitalist service.  Providing breathing treatments, steroids.  Patient management required discussion with the following services or consulting groups:  Hospitalist Service  Complexity of Problems Addressed Acute illness or injury that poses threat of life of bodily function  Additional Data Reviewed and Analyzed Further history obtained from: Past medical  history and medications listed in the EMR and Prior ED visit notes  Additional Factors Impacting ED Encounter Risk Consideration of hospitalization  Elmer Sow. Pilar Plate, MD [facility] Emergency Medicine [facility] [email or web address]  Final Clinical Impressions(s) / ED Diagnoses     ICD-10-CM   1. COPD exacerbation (HCC)  J44.1       ED Discharge Orders     None        Discharge Instructions Discussed with and Provided to Patient:   Discharge Instructions   None      Sabas Sous, MD 10/08/21 [phone]

## 2021-10-09 DIAGNOSIS — F418 Other specified anxiety disorders: Secondary | ICD-10-CM

## 2021-10-09 DIAGNOSIS — F1220 Cannabis dependence, uncomplicated: Secondary | ICD-10-CM

## 2021-10-09 DIAGNOSIS — Z720 Tobacco use: Secondary | ICD-10-CM

## 2021-10-09 DIAGNOSIS — J441 Chronic obstructive pulmonary disease with (acute) exacerbation: Secondary | ICD-10-CM

## 2021-10-09 LAB — SARS CORONAVIRUS 2 BY RT PCR: SARS Coronavirus 2 by RT PCR: NEGATIVE

## 2021-10-09 LAB — RAPID URINE DRUG SCREEN, HOSP PERFORMED
Amphetamines: NOT DETECTED
Barbiturates: NOT DETECTED
Benzodiazepines: POSITIVE — AB
Cocaine: NOT DETECTED
Opiates: NOT DETECTED
Tetrahydrocannabinol: POSITIVE — AB

## 2021-10-09 LAB — RESPIRATORY PANEL BY PCR

## 2021-10-09 LAB — CBC
HCT: 37.2 % (ref 36.0–46.0)
Hemoglobin: 12.3 g/dL (ref 12.0–15.0)
MCH: 29.5 pg (ref 26.0–34.0)
MCHC: 33.1 g/dL (ref 30.0–36.0)
MCV: 89.2 fL (ref 80.0–100.0)
Platelets: 308 10*3/uL (ref 150–400)
RBC: 4.17 MIL/uL (ref 3.87–5.11)
RDW: 14 % (ref 11.5–15.5)
WBC: 7.6 10*3/uL (ref 4.0–10.5)
nRBC: 0 % (ref 0.0–0.2)

## 2021-10-09 LAB — COMPREHENSIVE METABOLIC PANEL
ALT: 13 U/L (ref 0–44)
AST: 15 U/L (ref 15–41)
Albumin: 3.6 g/dL (ref 3.5–5.0)
Alkaline Phosphatase: 19 U/L — ABNORMAL LOW (ref 38–126)
Anion gap: 8 (ref 5–15)
BUN: 20 mg/dL (ref 6–20)
CO2: 28 mmol/L (ref 22–32)
Calcium: 8.6 mg/dL — ABNORMAL LOW (ref 8.9–10.3)
Chloride: 104 mmol/L (ref 98–111)
Creatinine, Ser: 0.89 mg/dL (ref 0.44–1.00)
GFR, Estimated: >60 mL/min (ref >60)
Glucose, Bld: 121 mg/dL — ABNORMAL HIGH (ref 70–99)
Potassium: 4 mmol/L (ref 3.5–5.1)
Sodium: 140 mmol/L (ref 135–145)
Total Bilirubin: 0.4 mg/dL (ref 0.3–1.2)
Total Protein: 6.5 g/dL (ref 6.5–8.1)

## 2021-10-09 LAB — HIV ANTIBODY (ROUTINE TESTING W REFLEX): HIV Screen 4th Generation wRfx: NONREACTIVE

## 2021-10-09 MED ORDER — DM-GUAIFENESIN ER 30-600 MG PO TB12
1.0000 | ORAL_TABLET | Freq: Two times a day (BID) | ORAL | Status: DC
  Administered 2021-10-09 – 2021-10-10 (×3): 1 via ORAL
  Filled 2021-10-09 (×3): qty 1

## 2021-10-09 MED ORDER — METHYLPREDNISOLONE SODIUM SUCC 125 MG IJ SOLR
60.0000 mg | Freq: Two times a day (BID) | INTRAMUSCULAR | Status: DC
  Administered 2021-10-09 – 2021-10-10 (×3): 60 mg via INTRAVENOUS
  Filled 2021-10-09 (×3): qty 2

## 2021-10-09 MED ORDER — IPRATROPIUM-ALBUTEROL 0.5-2.5 (3) MG/3ML IN SOLN
3.0000 mL | Freq: Four times a day (QID) | RESPIRATORY_TRACT | Status: DC
  Administered 2021-10-09 – 2021-10-10 (×3): 3 mL via RESPIRATORY_TRACT
  Filled 2021-10-09 (×5): qty 3

## 2021-10-09 NOTE — Progress Notes (Signed)
PROGRESS NOTE    Lindsay Joyce  TIW:[id number] DOB: 13-May-1960 DOA: 10/08/2021 PCP: Barbie Banner, MD     Brief Narrative:   Lindsay Joyce 61 y.o. WF PMHx Tobacco abuse, Drug abuse, anxiety/depression, chronic pain.   Presenting with shortness of breath. Her symptoms started a week ago. She has not have productive cough or fever. She has not had sick contacts. She continues to smoke tobacco and marijuana daily. She tried breathing treatments at home, but they did not help. She denies a formal diagnosis of COPD. When her symptoms did not improve last night, she decided to come to the ED for assistance. She denies any other aggravating or alleviating factors.    Subjective: A/O x4, positive SOB, negative CP, positive LEFT upper back pain with respiration.  Negative nausea.  Patient wants to bring a dog into the hospital.   Assessment & Plan: Covid vaccination;   Principal Problem:   Acute respiratory failure with hypoxia (HCC) Active Problems:   Smoker   Depression with anxiety   Marijuana dependence (HCC)   Leukocytosis   Tobacco abuse  Acute respiratory failure with hypoxia -DuoNeb - Incentive spirometry - Flutter valve - Mucinex DM - Solu-Medrol 60 mg BID -Titrate O2 to maintain SPO2 89 to 95%   Tobacco abuse - Counseled on nicotine cessation.  Drug abuse -8/5 urine drug test positive marijuana - Counseled on cessation of drug abuse. - TOC consult placed.  Resources available in the community     - counseled against further use     - she has declined a nicotine patch   Leukocytosis -8/5 resolved   Anxiety/Depression -Xanax 1 to 2 mg TID (home dose) - Zoloft 50 mg BID  Hypothyroidism - 8/5 TSH pending - Per MAR patient not taking her Synthroid 25 mcg daily.    Social:     - she doesn't have any insurance, and she will likely need nebs long-term     - will ask TOC to see for med assistance           Mobility Assessment (last 72 hours)      Mobility Assessment     Row Name 10/09/21 0100 10/08/21 2121 10/08/21 0445       Does patient have an order for bedrest or is patient medically unstable No - Continue assessment No - Continue assessment No - Continue assessment     What is the highest level of mobility based on the progressive mobility assessment? Level 6 (Walks independently in room and hall) - Balance while walking in room without assist - Complete Level 6 (Walks independently in room and hall) - Balance while walking in room without assist - Complete Level 6 (Walks independently in room and hall) - Balance while walking in room without assist - Complete                       DVT prophylaxis: SCD Code Status: Full Family Communication:  Status is: Inpatient    Dispo: The patient is from: Home              Anticipated d/c is to: Home              Anticipated d/c date is: 2 days              Patient currently is not medically stable to d/c.      Consultants:    Procedures/Significant Events:    I have personally reviewed  and interpreted all radiology studies and my findings are as above.  VENTILATOR SETTINGS: Room air 8/5 SPO2 92%   Cultures 8/5 SARS coronavirus negative 8/5 respiratory virus panel pending 8/5 sputum pending   Antimicrobials:    Devices    LINES / TUBES:      Continuous Infusions:   Objective: Vitals:   10/09/21 0615 10/09/21 0816 10/09/21 1439 10/09/21 1615  BP: 118/65  (!) 153/89   Pulse: 85  76   Resp: 20  18   Temp: 98 F (36.7 C)  98.1 F (36.7 C)   TempSrc:      SpO2: 93% 92% (!) 89% 91%  Weight:      Height:       No intake or output data in the 24 hours ending 10/09/21 1814 Filed Weights   10/08/21 0154  Weight: 59 kg    Examination:  General: A/O x4, positive acute respiratory distress cachectic, appears much older than stated age. Eyes: negative scleral hemorrhage, negative anisocoria, negative icterus ENT: Negative Runny nose,  negative gingival bleeding, Neck:  Negative scars, masses, torticollis, lymphadenopathy, JVD Lungs: Clear to auscultation bilaterally, positive expiratory wheezing LEFT>> RIGHT, negative crackles Cardiovascular: Regular rate and rhythm without murmur gallop or rub normal S1 and S2 Abdomen: negative abdominal pain, nondistended, positive soft, bowel sounds, no rebound, no ascites, no appreciable mass Extremities: No significant cyanosis, clubbing, or edema bilateral lower extremities Skin: Negative rashes, lesions, ulcers Psychiatric:  Negative depression, negative anxiety, negative fatigue, negative mania  Central nervous system:  Cranial nerves II through XII intact, tongue/uvula midline, all extremities muscle strength 5/5, sensation intact throughout, negative dysarthria, negative expressive aphasia, negative receptive aphasia.  .     Data Reviewed: Care during the described time interval was provided by me .  I have reviewed this patient's available data, including medical history, events of note, physical examination, and all test results as part of my evaluation.  CBC: Recent Labs  Lab 10/08/21 0213 10/09/21 0446  WBC 11.4* 7.6  HGB 14.6 12.3  HCT 44.3 37.2  MCV 88.6 89.2  PLT 358 308   Basic Metabolic Panel: Recent Labs  Lab 10/08/21 0213 10/09/21 0446  NA 139 140  K 3.5 4.0  CL 103 104  CO2 25 28  GLUCOSE 101* 121*  BUN 15 20  CREATININE 0.98 0.89  CALCIUM 9.0 8.6*   GFR: Estimated Creatinine Clearance: 54.5 mL/min (by C-G formula based on SCr of 0.89 mg/dL). Liver Function Tests: Recent Labs  Lab 10/09/21 0446  AST 15  ALT 13  ALKPHOS 19*  BILITOT 0.4  PROT 6.5  ALBUMIN 3.6   No results for input(s): "LIPASE", "AMYLASE" in the last 168 hours. No results for input(s): "AMMONIA" in the last 168 hours. Coagulation Profile: No results for input(s): "INR", "PROTIME" in the last 168 hours. Cardiac Enzymes: No results for input(s): "CKTOTAL", "CKMB",  "CKMBINDEX", "TROPONINI" in the last 168 hours. BNP (last 3 results) No results for input(s): "PROBNP" in the last 8760 hours. HbA1C: No results for input(s): "HGBA1C" in the last 72 hours. CBG: No results for input(s): "GLUCAP" in the last 168 hours. Lipid Profile: No results for input(s): "CHOL", "HDL", "LDLCALC", "TRIG", "CHOLHDL", "LDLDIRECT" in the last 72 hours. Thyroid Function Tests: No results for input(s): "TSH", "T4TOTAL", "FREET4", "T3FREE", "THYROIDAB" in the last 72 hours. Anemia Panel: No results for input(s): "VITAMINB12", "FOLATE", "FERRITIN", "TIBC", "IRON", "RETICCTPCT" in the last 72 hours. Sepsis Labs: No results for input(s): "PROCALCITON", "LATICACIDVEN" in the last  168 hours.  Recent Results (from the past 240 hour(s))  SARS Coronavirus 2 by RT PCR (hospital order, performed in [facility] hospital lab) *cepheid single result test* Anterior Nasal Swab     Status: None   Collection Time: 10/09/21 11:19 AM   Specimen: Anterior Nasal Swab  Result Value Ref Range Status   SARS Coronavirus 2 by RT PCR NEGATIVE NEGATIVE Final    Comment: (NOTE) SARS-CoV-2 target nucleic acids are NOT DETECTED.  The SARS-CoV-2 RNA is generally detectable in upper and lower respiratory specimens during the acute phase of infection. The lowest concentration of SARS-CoV-2 viral copies this assay can detect is 250 copies / mL. A negative result does not preclude SARS-CoV-2 infection and should not be used as the sole basis for treatment or other patient management decisions.  A negative result may occur with improper specimen collection / handling, submission of specimen other than nasopharyngeal swab, presence of viral mutation(s) within the areas targeted by this assay, and inadequate number of viral copies (<250 copies / mL). A negative result must be combined with clinical observations, patient history, and epidemiological information.  Fact Sheet for Patients:    RoadLapTop.co.za  Fact Sheet for Healthcare Providers: http://kim-miller.com/  This test is not yet approved or  cleared by the [place] FDA and has been authorized for detection and/or diagnosis of SARS-CoV-2 by FDA under an Emergency Use Authorization (EUA).  This EUA will remain in effect (meaning this test can be used) for the duration of the COVID-19 declaration under Section 564(b)(1) of the Act, 21 U.S.C. section 360bbb-3(b)(1), unless the authorization is terminated or revoked sooner.  Performed at [facility], [address] [place], [place] [address]          Radiology Studies: DG Chest 2 View  Result Date: 10/08/2021 CLINICAL DATA:  Shortness of breath and chest pain, initial encounter EXAM: CHEST - 2 VIEW COMPARISON:  06/16/2020 FINDINGS: The heart size and mediastinal contours are within normal limits. Both lungs are clear. The visualized skeletal structures are unremarkable. IMPRESSION: No active cardiopulmonary disease. Electronically Signed   By: Alcide Clever M.D.   On: 10/08/2021 02:26        Scheduled Meds:  ALPRAZolam  1-2 mg Oral TID   cholecalciferol  2,000 Units Oral Daily   dextromethorphan-guaiFENesin  1 tablet Oral BID   enoxaparin (LOVENOX) injection  40 mg Subcutaneous Q24H   ipratropium-albuterol  3 mL Nebulization QID   methylPREDNISolone (SOLU-MEDROL) injection  60 mg Intravenous Q12H   multivitamin with minerals  1 tablet Oral Daily   sertraline  50 mg Oral BID   Continuous Infusions:   LOS: 1 day    Time spent:40 min    Dametri Ozburn, Roselind Messier, MD [facility]   If 7PM-7AM, please contact night-coverage 10/09/2021, 6:14 PM

## 2021-10-10 LAB — CBC WITH DIFFERENTIAL/PLATELET
Abs Immature Granulocytes: 0.04 10*3/uL (ref 0.00–0.07)
Basophils Absolute: 0 10*3/uL (ref 0.0–0.1)
Basophils Relative: 0 %
Eosinophils Absolute: 0 10*3/uL (ref 0.0–0.5)
Eosinophils Relative: 0 %
HCT: 41.4 % (ref 36.0–46.0)
Hemoglobin: 13.5 g/dL (ref 12.0–15.0)
Immature Granulocytes: 0 %
Lymphocytes Relative: 26 %
Lymphs Abs: 2.5 10*3/uL (ref 0.7–4.0)
MCH: 29.2 pg (ref 26.0–34.0)
MCHC: 32.6 g/dL (ref 30.0–36.0)
MCV: 89.6 fL (ref 80.0–100.0)
Monocytes Absolute: 0.3 10*3/uL (ref 0.1–1.0)
Monocytes Relative: 3 %
Neutro Abs: 7 10*3/uL (ref 1.7–7.7)
Neutrophils Relative %: 71 %
Platelets: 347 10*3/uL (ref 150–400)
RBC: 4.62 MIL/uL (ref 3.87–5.11)
RDW: 14.2 % (ref 11.5–15.5)
WBC: 9.8 10*3/uL (ref 4.0–10.5)
nRBC: 0 % (ref 0.0–0.2)

## 2021-10-10 LAB — COMPREHENSIVE METABOLIC PANEL
ALT: 20 U/L (ref 0–44)
AST: 22 U/L (ref 15–41)
Albumin: 3.9 g/dL (ref 3.5–5.0)
Alkaline Phosphatase: 20 U/L — ABNORMAL LOW (ref 38–126)
Anion gap: 6 (ref 5–15)
BUN: 21 mg/dL — ABNORMAL HIGH (ref 6–20)
CO2: 28 mmol/L (ref 22–32)
Calcium: 9.1 mg/dL (ref 8.9–10.3)
Chloride: 106 mmol/L (ref 98–111)
Creatinine, Ser: 0.94 mg/dL (ref 0.44–1.00)
GFR, Estimated: >60 mL/min (ref >60)
Glucose, Bld: 139 mg/dL — ABNORMAL HIGH (ref 70–99)
Potassium: 4.1 mmol/L (ref 3.5–5.1)
Sodium: 140 mmol/L (ref 135–145)
Total Bilirubin: 0.4 mg/dL (ref 0.3–1.2)
Total Protein: 7.1 g/dL (ref 6.5–8.1)

## 2021-10-10 LAB — PHOSPHORUS: Phosphorus: 4.6 mg/dL (ref 2.5–4.6)

## 2021-10-10 LAB — MAGNESIUM: Magnesium: 2.4 mg/dL (ref 1.7–2.4)

## 2021-10-10 LAB — TSH: TSH: 0.616 u[IU]/mL (ref 0.350–4.500)

## 2021-10-10 MED ORDER — IPRATROPIUM-ALBUTEROL 0.5-2.5 (3) MG/3ML IN SOLN
3.0000 mL | Freq: Three times a day (TID) | RESPIRATORY_TRACT | Status: DC
Start: 2021-10-10 — End: 2021-10-10

## 2021-10-10 MED ORDER — DM-GUAIFENESIN ER 30-600 MG PO TB12: 1.0000 | ORAL_TABLET | Freq: Two times a day (BID) | ORAL | 0 refills | Status: AC

## 2021-10-10 MED ORDER — SERTRALINE HCL 50 MG PO TABS
50.0000 mg | ORAL_TABLET | Freq: Two times a day (BID) | ORAL | 0 refills | Status: AC
Start: 2021-10-10 — End: ?

## 2021-10-10 MED ORDER — PREDNISONE 10 MG PO TABS: 40.0000 mg | ORAL_TABLET | Freq: Every day | ORAL | 0 refills | Status: DC

## 2021-10-10 NOTE — Progress Notes (Signed)
Patient discharged home.  Discharge instructions explained, patient verbalizes understanding 

## 2021-10-10 NOTE — Discharge Summary (Signed)
Physician Discharge Summary  Lindsay Joyce SFK:[id number] DOB: 27-Aug-1960 DOA: 10/08/2021  PCP: Lindsay Banner, MD  Admit date: 10/08/2021 Discharge date: 10/10/2021  Time spent: 35 minutes  Recommendations for Outpatient Follow-up:   Acute respiratory failure with hypoxia -DuoNeb - Incentive spirometry - Flutter valve - Mucinex DM - Prednisone 40 mg daily x7 days   SATURATION QUALIFICATIONS: (This note is used to comply with regulatory documentation for home oxygen) Patient Saturations on Room Air at Rest = 99% Patient Saturations on Room Air while Ambulating = 93% Patient Saturations on 0 Liters of oxygen while Ambulating = 93% Please briefly explain why patient needs home oxygen -Patient does not meet criteria for home O2  Tobacco abuse - Counseled on nicotine cessation.   Drug abuse -8/5 urine drug test positive marijuana - Counseled on cessation of drug abuse. - TOC consult placed.  Resources available in the community -counseled against further use -she has declined a nicotine patch   Leukocytosis -8/5 resolved   Anxiety/Depression -Xanax 1 to 2 mg TID (home dose) - Zoloft 50 mg BID   Hypothyroidism - 8/5 TSH WNL - Synthroid 25 mcg daily.   Discharge Diagnoses:  Principal Problem:   Acute respiratory failure with hypoxia (HCC) Active Problems:   Smoker   Depression with anxiety   Marijuana dependence (HCC)   Leukocytosis   Tobacco abuse   Discharge Condition: Stable  Diet recommendation: Heart healthy  Filed Weights   10/08/21 0154  Weight: 59 kg    History of present illness:  Lindsay Joyce 61 y.o. WF PMHx Tobacco abuse, Drug abuse, anxiety/depression, chronic pain.    Presenting with shortness of breath. Her symptoms started a week ago. She has not have productive cough or fever. She has not had sick contacts. She continues to smoke tobacco and marijuana daily. She tried breathing treatments at home, but they did not help. She denies a formal  diagnosis of COPD. When her symptoms did not improve last night, she decided to come to the ED for assistance. She denies any other aggravating or alleviating factors.    Hospital Course:  See above   Cultures  8/5 SARS coronavirus negative 8/5 respiratory virus panel negative 8/5 sputum pending     Discharge Exam: Vitals:   10/09/21 2111 10/10/21 0454 10/10/21 1105 10/10/21 1109  BP: (!) 141/72 108/76  112/70  Pulse: 84 71  64  Resp: 18 20  16   Temp: 98.1 F (36.7 C) 98 F (36.7 C)  98 F (36.7 C)  TempSrc: Oral Oral  Oral  SpO2: 97% 100% 96% 100%  Weight:      Height:       General: A/O x4, positive acute respiratory distress cachectic, appears much older than stated age. Eyes: negative scleral hemorrhage, negative anisocoria, negative icterus ENT: Negative Runny nose, negative gingival bleeding, Neck:  Negative scars, masses, torticollis, lymphadenopathy, JVD Lungs: Clear to auscultation bilaterally, positive expiratory wheezing LEFT>> RIGHT, negative crackles  Discharge Instructions   Allergies as of 10/10/2021       Reactions   Codeine    Other reaction(s): GI Upset (intolerance)   Diclofenac Sodium Nausea And Vomiting   Gabapentin    Other reaction(s): GI Upset (intolerance)   Hydrocodone-acetaminophen    Other reaction(s): GI Upset (intolerance)   Hydromorphone    Other reaction(s): GI Upset (intolerance)   Meloxicam Other (See Comments)   Upset stomach   Oxymorphone Itching   Risperidone Other (See Comments)   "Made nose itch  really bad"   Tramadol Other (See Comments)   Headache, Suicidal thoughts   Morphine And Related Anxiety   "It tore up my nerves."        Medication List     TAKE these medications    albuterol 108 (90 Base) MCG/ACT inhaler Commonly known as: VENTOLIN HFA Inhale 2 puffs into the lungs every 4 (four) hours as needed for wheezing or shortness of breath.   ALPRAZolam 1 MG tablet Commonly known as: XANAX Take 1-2  tablets by mouth 3 (three) times daily.   aspirin EC 81 MG tablet Take 325 mg by mouth daily.   B-complex with vitamin C tablet Take 1 tablet by mouth daily.   dextromethorphan-guaiFENesin 30-600 MG 12hr tablet Commonly known as: MUCINEX DM Take 1 tablet by mouth 2 (two) times daily.   levothyroxine 25 MCG tablet Commonly known as: SYNTHROID Take 25 mcg by mouth daily.   multivitamin capsule Take 1 capsule by mouth daily.   oxyCODONE-acetaminophen 7.5-325 MG tablet Commonly known as: PERCOCET Take 1 tablet by mouth 2 (two) times daily as needed for moderate pain.   predniSONE 10 MG tablet Commonly known as: DELTASONE Take 4 tablets (40 mg total) by mouth daily.   sertraline 50 MG tablet Commonly known as: ZOLOFT Take 1 tablet (50 mg total) by mouth 2 (two) times daily. What changed: medication strength   Vitamin D3 50 MCG (2000 UT) capsule Take 2,000 Units by mouth daily.       Allergies  Allergen Reactions   Codeine     Other reaction(s): GI Upset (intolerance)   Diclofenac Sodium Nausea And Vomiting   Gabapentin     Other reaction(s): GI Upset (intolerance)   Hydrocodone-Acetaminophen     Other reaction(s): GI Upset (intolerance)   Hydromorphone     Other reaction(s): GI Upset (intolerance)   Meloxicam Other (See Comments)    Upset stomach   Oxymorphone Itching   Risperidone Other (See Comments)    "Made nose itch really bad"   Tramadol Other (See Comments)    Headache, Suicidal thoughts   Morphine And Related Anxiety    "It tore up my nerves."    Follow-up Information     [employer]Health Insurance Follow up.   Contact information: [email or web address] for health insurance.  [phone] (TTY: [phone]) Available 24 hours a day, 7 days a week (except holidays)                 The results of significant diagnostics from this hospitalization (including imaging, microbiology, ancillary and laboratory) are listed below for  reference.    Significant Diagnostic Studies: DG Chest 2 View  Result Date: 10/08/2021 CLINICAL DATA:  Shortness of breath and chest pain, initial encounter EXAM: CHEST - 2 VIEW COMPARISON:  06/16/2020 FINDINGS: The heart size and mediastinal contours are within normal limits. Both lungs are clear. The visualized skeletal structures are unremarkable. IMPRESSION: No active cardiopulmonary disease. Electronically Signed   By: Alcide Clever M.D.   On: 10/08/2021 02:26    Microbiology: Recent Results (from the past 240 hour(s))  SARS Coronavirus 2 by RT PCR (hospital order, performed in [facility] hospital lab) *cepheid single result test* Anterior Nasal Swab     Status: None   Collection Time: 10/09/21 11:19 AM   Specimen: Anterior Nasal Swab  Result Value Ref Range Status   SARS Coronavirus 2 by RT PCR NEGATIVE NEGATIVE Final    Comment: (NOTE) SARS-CoV-2 target nucleic acids are NOT DETECTED.  The SARS-CoV-2  RNA is generally detectable in upper and lower respiratory specimens during the acute phase of infection. The lowest concentration of SARS-CoV-2 viral copies this assay can detect is 250 copies / mL. A negative result does not preclude SARS-CoV-2 infection and should not be used as the sole basis for treatment or other patient management decisions.  A negative result may occur with improper specimen collection / handling, submission of specimen other than nasopharyngeal swab, presence of viral mutation(s) within the areas targeted by this assay, and inadequate number of viral copies (<250 copies / mL). A negative result must be combined with clinical observations, patient history, and epidemiological information.  Fact Sheet for Patients:   RoadLapTop.co.za  Fact Sheet for Healthcare Providers: http://kim-miller.com/  This test is not yet approved or  cleared by the [place] FDA and has been authorized for detection and/or  diagnosis of SARS-CoV-2 by FDA under an Emergency Use Authorization (EUA).  This EUA will remain in effect (meaning this test can be used) for the duration of the COVID-19 declaration under Section 564(b)(1) of the Act, 21 U.S.C. section 360bbb-3(b)(1), unless the authorization is terminated or revoked sooner.  Performed at [facility], [address], [place], [place] [address]   Respiratory (~20 pathogens) panel by PCR     Status: None   Collection Time: 10/09/21 11:20 AM   Specimen: Nasopharyngeal Swab; Respiratory  Result Value Ref Range Status   Adenovirus NOT DETECTED NOT DETECTED Final   Coronavirus 229E NOT DETECTED NOT DETECTED Final    Comment: (NOTE) The Coronavirus on the Respiratory Panel, DOES NOT test for the novel  Coronavirus (2019 nCoV)    Coronavirus HKU1 NOT DETECTED NOT DETECTED Final   Coronavirus NL63 NOT DETECTED NOT DETECTED Final   Coronavirus OC43 NOT DETECTED NOT DETECTED Final   Metapneumovirus NOT DETECTED NOT DETECTED Final   Rhinovirus / Enterovirus NOT DETECTED NOT DETECTED Final   Influenza A NOT DETECTED NOT DETECTED Final   Influenza B NOT DETECTED NOT DETECTED Final   Parainfluenza Virus 1 NOT DETECTED NOT DETECTED Final   Parainfluenza Virus 2 NOT DETECTED NOT DETECTED Final   Parainfluenza Virus 3 NOT DETECTED NOT DETECTED Final   Parainfluenza Virus 4 NOT DETECTED NOT DETECTED Final   Respiratory Syncytial Virus NOT DETECTED NOT DETECTED Final   Bordetella pertussis NOT DETECTED NOT DETECTED Final   Bordetella Parapertussis NOT DETECTED NOT DETECTED Final   Chlamydophila pneumoniae NOT DETECTED NOT DETECTED Final   Mycoplasma pneumoniae NOT DETECTED NOT DETECTED Final    Comment: Performed at [facility], [address], [place], [place] [address]     Labs: Basic Metabolic Panel: Recent Labs  Lab 10/08/21 0213 10/09/21 0446 10/10/21 0520  NA 139 140 140  K 3.5 4.0 4.1  CL 103 104 106  CO2 25 28 28    GLUCOSE 101* 121* 139*  BUN 15 20 21*  CREATININE 0.98 0.89 0.94  CALCIUM 9.0 8.6* 9.1  MG  --   --  2.4  PHOS  --   --  4.6   Liver Function Tests: Recent Labs  Lab 10/09/21 0446 10/10/21 0520  AST 15 22  ALT 13 20  ALKPHOS 19* 20*  BILITOT 0.4 0.4  PROT 6.5 7.1  ALBUMIN 3.6 3.9   No results for input(s): "LIPASE", "AMYLASE" in the last 168 hours. No results for input(s): "AMMONIA" in the last 168 hours. CBC: Recent Labs  Lab 10/08/21 0213 10/09/21 0446 10/10/21 0520  WBC 11.4*  7.6 9.8  NEUTROABS  --   --  7.0  HGB 14.6 12.3 13.5  HCT 44.3 37.2 41.4  MCV 88.6 89.2 89.6  PLT 358 308 347   Cardiac Enzymes: No results for input(s): "CKTOTAL", "CKMB", "CKMBINDEX", "TROPONINI" in the last 168 hours. BNP: BNP (last 3 results) No results for input(s): "BNP" in the last 8760 hours.  ProBNP (last 3 results) No results for input(s): "PROBNP" in the last 8760 hours.  CBG: No results for input(s): "GLUCAP" in the last 168 hours.     Signed:  Carolyne Littles, MD [facility]

## 2021-10-10 NOTE — Progress Notes (Signed)
SATURATION QUALIFICATIONS: (This note is used to comply with regulatory documentation for home oxygen)  Patient Saturations on Room Air at Rest = 99%  Patient Saturations on Room Air while Ambulating = 93%  Patient Saturations on 0 Liters of oxygen while Ambulating = 93%  Please briefly explain why patient needs home oxygen:

## 2021-11-29 ENCOUNTER — Inpatient Hospital Stay (HOSPITAL_COMMUNITY)
Admission: EM | Admit: 2021-11-29 | Discharge: 2021-12-04 | DRG: 190 | Disposition: A | Discharge status: Home / Self Care | Payer: Self-pay | Attending: Internal Medicine | Admitting: Internal Medicine

## 2021-11-29 ENCOUNTER — Emergency Department (HOSPITAL_COMMUNITY): Payer: Self-pay

## 2021-11-29 ENCOUNTER — Encounter (HOSPITAL_COMMUNITY): Payer: Self-pay

## 2021-11-29 ENCOUNTER — Other Ambulatory Visit: Payer: Self-pay

## 2021-11-29 DIAGNOSIS — J441 Chronic obstructive pulmonary disease with (acute) exacerbation: Principal | ICD-10-CM

## 2021-11-29 DIAGNOSIS — Z888 Allergy status to other drugs, medicaments and biological substances status: Secondary | ICD-10-CM

## 2021-11-29 DIAGNOSIS — S32019A Unspecified fracture of first lumbar vertebra, initial encounter for closed fracture: Secondary | ICD-10-CM | POA: Diagnosis present

## 2021-11-29 DIAGNOSIS — I5031 Acute diastolic (congestive) heart failure: Secondary | ICD-10-CM | POA: Diagnosis present

## 2021-11-29 DIAGNOSIS — F418 Other specified anxiety disorders: Secondary | ICD-10-CM | POA: Diagnosis present

## 2021-11-29 DIAGNOSIS — F1210 Cannabis abuse, uncomplicated: Secondary | ICD-10-CM | POA: Diagnosis present

## 2021-11-29 DIAGNOSIS — K5900 Constipation, unspecified: Secondary | ICD-10-CM | POA: Diagnosis present

## 2021-11-29 DIAGNOSIS — Z8673 Personal history of transient ischemic attack (TIA), and cerebral infarction without residual deficits: Secondary | ICD-10-CM

## 2021-11-29 DIAGNOSIS — R9431 Abnormal electrocardiogram [ECG] [EKG]: Secondary | ICD-10-CM | POA: Diagnosis present

## 2021-11-29 DIAGNOSIS — Z20822 Contact with and (suspected) exposure to covid-19: Secondary | ICD-10-CM | POA: Diagnosis present

## 2021-11-29 DIAGNOSIS — Z7982 Long term (current) use of aspirin: Secondary | ICD-10-CM

## 2021-11-29 DIAGNOSIS — Z8249 Family history of ischemic heart disease and other diseases of the circulatory system: Secondary | ICD-10-CM

## 2021-11-29 DIAGNOSIS — Z79899 Other long term (current) drug therapy: Secondary | ICD-10-CM

## 2021-11-29 DIAGNOSIS — Z9049 Acquired absence of other specified parts of digestive tract: Secondary | ICD-10-CM

## 2021-11-29 DIAGNOSIS — E876 Hypokalemia: Secondary | ICD-10-CM | POA: Diagnosis present

## 2021-11-29 DIAGNOSIS — F32A Depression, unspecified: Secondary | ICD-10-CM | POA: Diagnosis present

## 2021-11-29 DIAGNOSIS — F419 Anxiety disorder, unspecified: Secondary | ICD-10-CM | POA: Diagnosis present

## 2021-11-29 DIAGNOSIS — Z885 Allergy status to narcotic agent status: Secondary | ICD-10-CM

## 2021-11-29 DIAGNOSIS — M4856XA Collapsed vertebra, not elsewhere classified, lumbar region, initial encounter for fracture: Secondary | ICD-10-CM | POA: Diagnosis present

## 2021-11-29 DIAGNOSIS — J9601 Acute respiratory failure with hypoxia: Secondary | ICD-10-CM | POA: Diagnosis present

## 2021-11-29 DIAGNOSIS — G8929 Other chronic pain: Secondary | ICD-10-CM | POA: Diagnosis present

## 2021-11-29 DIAGNOSIS — Z23 Encounter for immunization: Secondary | ICD-10-CM

## 2021-11-29 DIAGNOSIS — Z720 Tobacco use: Secondary | ICD-10-CM | POA: Diagnosis present

## 2021-11-29 DIAGNOSIS — J439 Emphysema, unspecified: Principal | ICD-10-CM | POA: Diagnosis present

## 2021-11-29 DIAGNOSIS — Z8041 Family history of malignant neoplasm of ovary: Secondary | ICD-10-CM

## 2021-11-29 DIAGNOSIS — F17210 Nicotine dependence, cigarettes, uncomplicated: Secondary | ICD-10-CM | POA: Diagnosis present

## 2021-11-29 DIAGNOSIS — Z7952 Long term (current) use of systemic steroids: Secondary | ICD-10-CM

## 2021-11-29 NOTE — ED Notes (Signed)
Pt placed on 2L [place], room air 88%. Pt reports having COPD but is not on home oxygen.

## 2021-11-29 NOTE — ED Triage Notes (Signed)
Pt reports with [facility] for some time now. Pt states that it has gotten more severe tonight.

## 2021-11-30 ENCOUNTER — Inpatient Hospital Stay (HOSPITAL_COMMUNITY): Payer: Self-pay

## 2021-11-30 DIAGNOSIS — J441 Chronic obstructive pulmonary disease with (acute) exacerbation: Principal | ICD-10-CM | POA: Diagnosis present

## 2021-11-30 DIAGNOSIS — R9431 Abnormal electrocardiogram [ECG] [EKG]: Secondary | ICD-10-CM

## 2021-11-30 LAB — BASIC METABOLIC PANEL
Anion gap: 6 (ref 5–15)
BUN: 11 mg/dL (ref 6–20)
CO2: 26 mmol/L (ref 22–32)
Calcium: 8.7 mg/dL — ABNORMAL LOW (ref 8.9–10.3)
Chloride: 109 mmol/L (ref 98–111)
Creatinine, Ser: 0.96 mg/dL (ref 0.44–1.00)
GFR, Estimated: >60 mL/min (ref >60)
Glucose, Bld: 108 mg/dL — ABNORMAL HIGH (ref 70–99)
Potassium: 3.8 mmol/L (ref 3.5–5.1)
Sodium: 141 mmol/L (ref 135–145)

## 2021-11-30 LAB — CBC WITH DIFFERENTIAL/PLATELET
Abs Immature Granulocytes: 0.01 10*3/uL (ref 0.00–0.07)
Abs Immature Granulocytes: 0.01 10*3/uL (ref 0.00–0.07)
Basophils Absolute: 0.1 10*3/uL (ref 0.0–0.1)
Basophils Absolute: 0 10*3/uL (ref 0.0–0.1)
Basophils Relative: 1 %
Basophils Relative: 0 %
Eosinophils Absolute: 1.9 10*3/uL — ABNORMAL HIGH (ref 0.0–0.5)
Eosinophils Absolute: 0 10*3/uL (ref 0.0–0.5)
Eosinophils Relative: 17 %
Eosinophils Relative: 0 %
HCT: 43.2 % (ref 36.0–46.0)
HCT: 40.8 % (ref 36.0–46.0)
Hemoglobin: 13.9 g/dL (ref 12.0–15.0)
Hemoglobin: 13.2 g/dL (ref 12.0–15.0)
Immature Granulocytes: 0 %
Immature Granulocytes: 0 %
Lymphocytes Relative: 34 %
Lymphocytes Relative: 30 %
Lymphs Abs: 3.9 10*3/uL (ref 0.7–4.0)
Lymphs Abs: 1.5 10*3/uL (ref 0.7–4.0)
MCH: 29.6 pg (ref 26.0–34.0)
MCH: 29.3 pg (ref 26.0–34.0)
MCHC: 32.2 g/dL (ref 30.0–36.0)
MCHC: 32.4 g/dL (ref 30.0–36.0)
MCV: 91.9 fL (ref 80.0–100.0)
MCV: 90.7 fL (ref 80.0–100.0)
Monocytes Absolute: 0.5 10*3/uL (ref 0.1–1.0)
Monocytes Absolute: 0 10*3/uL — ABNORMAL LOW (ref 0.1–1.0)
Monocytes Relative: 5 %
Monocytes Relative: 1 %
Neutro Abs: 5.1 10*3/uL (ref 1.7–7.7)
Neutro Abs: 3.3 10*3/uL (ref 1.7–7.7)
Neutrophils Relative %: 43 %
Neutrophils Relative %: 69 %
Platelets: 347 10*3/uL (ref 150–400)
Platelets: 341 10*3/uL (ref 150–400)
RBC: 4.7 MIL/uL (ref 3.87–5.11)
RBC: 4.5 MIL/uL (ref 3.87–5.11)
RDW: 13.8 % (ref 11.5–15.5)
RDW: 14.2 % (ref 11.5–15.5)
WBC: 11.6 10*3/uL — ABNORMAL HIGH (ref 4.0–10.5)
WBC: 4.9 10*3/uL (ref 4.0–10.5)
nRBC: 0 % (ref 0.0–0.2)
nRBC: 0 % (ref 0.0–0.2)

## 2021-11-30 LAB — BLOOD GAS, VENOUS
Acid-Base Excess: 3 mmol/L — ABNORMAL HIGH (ref 0.0–2.0)
Bicarbonate: 27.1 mmol/L (ref 20.0–28.0)
O2 Saturation: 90.5 %
Patient temperature: 36.1
pCO2, Ven: 37 mmHg — ABNORMAL LOW (ref 44–60)
pH, Ven: 7.46 — ABNORMAL HIGH (ref 7.25–7.43)
pO2, Ven: 54 mmHg — ABNORMAL HIGH (ref 32–45)

## 2021-11-30 LAB — TROPONIN I (HIGH SENSITIVITY)
Troponin I (High Sensitivity): 48 ng/L — ABNORMAL HIGH (ref <18)
Troponin I (High Sensitivity): 38 ng/L — ABNORMAL HIGH (ref <18)

## 2021-11-30 LAB — ECHOCARDIOGRAM COMPLETE
AR max vel: 2.43 cm2
AV Peak grad: 11.2 mmHg
Ao pk vel: 1.67 m/s
Area-P 1/2: 5.32 cm2
Height: 62 in
S' Lateral: 2.5 cm
Weight: 2256 oz

## 2021-11-30 LAB — RESPIRATORY PANEL BY PCR

## 2021-11-30 LAB — CREATININE, SERUM
Creatinine, Ser: 0.88 mg/dL (ref 0.44–1.00)
GFR, Estimated: >60 mL/min (ref >60)

## 2021-11-30 LAB — HEMOGLOBIN A1C
Hgb A1c MFr Bld: 5.6 % (ref 4.8–5.6)
Mean Plasma Glucose: 114.02 mg/dL

## 2021-11-30 LAB — TSH: TSH: 1.463 u[IU]/mL (ref 0.350–4.500)

## 2021-11-30 LAB — BRAIN NATRIURETIC PEPTIDE: B Natriuretic Peptide: 168.1 pg/mL — ABNORMAL HIGH (ref 0.0–100.0)

## 2021-11-30 LAB — SARS CORONAVIRUS 2 BY RT PCR: SARS Coronavirus 2 by RT PCR: NEGATIVE

## 2021-11-30 MED ORDER — SODIUM CHLORIDE 0.9 % IV SOLN
1.0000 g | Freq: Every day | INTRAVENOUS | Status: DC
  Administered 2021-11-30 – 2021-12-03 (×4): 1 g via INTRAVENOUS
  Filled 2021-11-30 (×4): qty 10

## 2021-11-30 MED ORDER — PREDNISONE 20 MG PO TABS
40.0000 mg | ORAL_TABLET | Freq: Every day | ORAL | Status: AC
  Administered 2021-12-01 – 2021-12-04 (×4): 40 mg via ORAL
  Filled 2021-11-30 (×4): qty 2

## 2021-11-30 MED ORDER — IPRATROPIUM-ALBUTEROL 0.5-2.5 (3) MG/3ML IN SOLN
3.0000 mL | Freq: Four times a day (QID) | RESPIRATORY_TRACT | Status: DC
  Administered 2021-11-30 – 2021-12-01 (×6): 3 mL via RESPIRATORY_TRACT
  Filled 2021-11-30 (×7): qty 3

## 2021-11-30 MED ORDER — ALPRAZOLAM 0.5 MG PO TABS
1.0000 mg | ORAL_TABLET | Freq: Three times a day (TID) | ORAL | Status: DC
  Administered 2021-11-30: 1 mg via ORAL
  Filled 2021-11-30: qty 2

## 2021-11-30 MED ORDER — METHYLPREDNISOLONE SODIUM SUCC 125 MG IJ SOLR
125.0000 mg | Freq: Once | INTRAMUSCULAR | Status: AC
  Administered 2021-11-30: 125 mg via INTRAVENOUS
  Filled 2021-11-30: qty 2

## 2021-11-30 MED ORDER — POLYETHYLENE GLYCOL 3350 17 G PO PACK
17.0000 g | PACK | Freq: Every day | ORAL | Status: DC
  Administered 2021-11-30 – 2021-12-04 (×3): 17 g via ORAL
  Filled 2021-11-30 (×5): qty 1

## 2021-11-30 MED ORDER — IPRATROPIUM BROMIDE 0.02 % IN SOLN
0.5000 mg | Freq: Once | RESPIRATORY_TRACT | Status: AC
  Administered 2021-11-30: 0.5 mg via RESPIRATORY_TRACT
  Filled 2021-11-30 (×2): qty 2.5

## 2021-11-30 MED ORDER — SERTRALINE HCL 50 MG PO TABS
50.0000 mg | ORAL_TABLET | Freq: Two times a day (BID) | ORAL | Status: DC
  Administered 2021-11-30 – 2021-12-04 (×9): 50 mg via ORAL
  Filled 2021-11-30 (×9): qty 1

## 2021-11-30 MED ORDER — OXYCODONE-ACETAMINOPHEN 7.5-325 MG PO TABS
1.0000 | ORAL_TABLET | Freq: Two times a day (BID) | ORAL | Status: DC | PRN
  Administered 2021-11-30 – 2021-12-04 (×5): 1 via ORAL
  Filled 2021-11-30 (×5): qty 1

## 2021-11-30 MED ORDER — ALPRAZOLAM 0.5 MG PO TABS
1.0000 mg | ORAL_TABLET | Freq: Once | ORAL | Status: AC
  Administered 2021-11-30: 1 mg via ORAL
  Filled 2021-11-30: qty 2

## 2021-11-30 MED ORDER — ALBUTEROL SULFATE (2.5 MG/3ML) 0.083% IN NEBU
10.0000 mg/h | INHALATION_SOLUTION | Freq: Once | RESPIRATORY_TRACT | Status: AC
  Administered 2021-11-30: 10 mg/h via RESPIRATORY_TRACT
  Filled 2021-11-30 (×2): qty 3

## 2021-11-30 MED ORDER — ONDANSETRON HCL 4 MG PO TABS: 4.0000 mg | ORAL_TABLET | Freq: Four times a day (QID) | ORAL | Status: DC | PRN

## 2021-11-30 MED ORDER — ENOXAPARIN SODIUM 40 MG/0.4ML IJ SOSY
40.0000 mg | PREFILLED_SYRINGE | Freq: Every day | INTRAMUSCULAR | Status: DC
  Administered 2021-11-30 – 2021-12-02 (×3): 40 mg via SUBCUTANEOUS
  Filled 2021-11-30 (×3): qty 0.4

## 2021-11-30 MED ORDER — ALPRAZOLAM 0.5 MG PO TABS
2.0000 mg | ORAL_TABLET | Freq: Three times a day (TID) | ORAL | Status: DC
  Administered 2021-11-30 – 2021-12-04 (×12): 2 mg via ORAL
  Filled 2021-11-30 (×13): qty 4

## 2021-11-30 MED ORDER — LACTULOSE 10 GM/15ML PO SOLN
30.0000 g | Freq: Once | ORAL | Status: AC
  Administered 2021-11-30: 30 g via ORAL
  Filled 2021-11-30: qty 60

## 2021-11-30 MED ORDER — ASPIRIN 325 MG PO TBEC
325.0000 mg | DELAYED_RELEASE_TABLET | Freq: Every day | ORAL | Status: DC
  Administered 2021-11-30: 325 mg via ORAL
  Filled 2021-11-30 (×2): qty 1

## 2021-11-30 MED ORDER — ONDANSETRON HCL 4 MG/2ML IJ SOLN: 4.0000 mg | Freq: Four times a day (QID) | INTRAMUSCULAR | Status: DC | PRN

## 2021-11-30 MED ORDER — METHYLPREDNISOLONE SODIUM SUCC 40 MG IJ SOLR
40.0000 mg | Freq: Two times a day (BID) | INTRAMUSCULAR | Status: AC
  Administered 2021-11-30 – 2021-12-01 (×2): 40 mg via INTRAVENOUS
  Filled 2021-11-30 (×2): qty 1

## 2021-11-30 MED ORDER — ALBUTEROL SULFATE (2.5 MG/3ML) 0.083% IN NEBU: 2.5000 mg | INHALATION_SOLUTION | RESPIRATORY_TRACT | Status: DC | PRN

## 2021-11-30 NOTE — H&P (Addendum)
History and Physical    Lindsay Joyce:[id number] DOB: [birth year] DOA: 11/29/2021  PCP: [name], MD  Patient coming from: home  I have personally briefly reviewed patient's old medical records in [place]  Chief Complaint: SOB  HPI: Lindsay Joyce is a 61 y.o. female with medical history significant of Tobacco abuse, Drug abuse, anxiety/depression, chronic pain,CVA, COPD ( no formal dx) , who has interim history of hospitalization 8/4-8/6 with similar symptoms and at that time was diagnosed with acute respiratory failure with hypoxemia presumed due to undiagnosed COPD.  Patient now returns with few days of increasing sob despite use of her nebulizer. Patient notes she became more concerned when pulse ox at home was 86% Patient currently denies any n/v/d/fever but noted hot/cold chills and constipation.   ED Course:  Vitals 122/79, hr 91, rr 20  Sat 89% on ra Labs: Wbc 11.6, hgb 13.9, plt 347  NA 141, K 3.8. cr 0.96,  Cxr:NAD Respiratory panel neg EKG:  Snr , st segment changes appear more prominent than prior  Tx: solumedrole , atrovent/albuterol  Review of Systems: As per HPI otherwise 10 point review of systems negative.   Past Medical History:  Diagnosis Date   Anxiety    Chronic interstitial cystitis    Depression    Dog bite of hand, left, initial encounter 08/23/2019   Shortness of breath    Smoker    Stroke [facility])    questionable hx-years ago-no documentation    Past Surgical History:  Procedure Laterality Date   CHOLECYSTECTOMY  2008   CYSTO WITH HYDRODISTENSION N/A 03/12/2013   Procedure: CYSTOSCOPY/HYDRODISTENSION/INSTILLATION OF MARCAINE AND PYRIDIUM;  Surgeon: Irine Seal, MD;  Location[facility];  Service: Urology;  Laterality: N/A;   CYSTOSCOPY WITH URETHRAL DILATATION N/A 03/12/2013   Procedure: CYSTOSCOPY WITH URETHRAL DILATATION;  Surgeon: Irine Seal, MD;  Location[facility];  Service: Urology;   Laterality: N/A;   I & D EXTREMITY Left 08/23/2019   Procedure: IRRIGATION AND DEBRIDEMENT EXTREMITY;  Surgeon: Roseanne Kaufman, MD;  Location: [facility];  Service: Orthopedics;  Laterality: Left;   LAVH,BSO  12/09   URETHRAL DILATION/HOD  2008   X2     reports that she has been smoking cigarettes. She has a 46.00 pack-year smoking history. She has never used smokeless tobacco. She reports current drug use. Drug: Marijuana. She reports that she does not drink alcohol.  Allergies  Allergen Reactions   Codeine     Other reaction(s): GI Upset (intolerance)   Diclofenac Sodium Nausea And Vomiting   Gabapentin     Other reaction(s): GI Upset (intolerance)   Hydrocodone-Acetaminophen     Other reaction(s): GI Upset (intolerance)   Hydromorphone     Other reaction(s): GI Upset (intolerance)   Meloxicam Other (See Comments)    Upset stomach   Oxymorphone Itching   Risperidone Other (See Comments)    "Made nose itch really bad"   Tramadol Other (See Comments)    Headache, Suicidal thoughts   Morphine And Related Anxiety    "It tore up my nerves."    Family History  Problem Relation Age of Onset   Heart disease Mother    Cancer Mother        CERVICAL   Hypertension Mother        DECEASED   Ovarian cancer Mother    Cancer Father        PROSTATE   Hypertension Father  DECEASED    Prior to Admission medications   Medication Sig Start Date End Date Taking? Authorizing Provider  albuterol (VENTOLIN HFA) 108 (90 Base) MCG/ACT inhaler Inhale 2 puffs into the lungs every 4 (four) hours as needed for wheezing or shortness of breath. 06/17/20   Garlon Hatchet, PA-C  ALPRAZolam Prudy Feeler) 1 MG tablet Take 1-2 tablets by mouth 3 (three) times daily. 05/18/15   [provider]  aspirin EC 81 MG tablet Take 325 mg by mouth daily. Patient not taking: Reported on 10/08/2021    [provider]  B Complex-C (B-COMPLEX WITH VITAMIN C) tablet Take 1 tablet by mouth daily.     [provider]  Cholecalciferol (VITAMIN D3) 2000 units capsule Take 2,000 Units by mouth daily.    [provider]  dextromethorphan-guaiFENesin (MUCINEX DM) 30-600 MG 12hr tablet Take 1 tablet by mouth 2 (two) times daily. 10/10/21   Drema Dallas, MD  levothyroxine (SYNTHROID) 25 MCG tablet Take 25 mcg by mouth daily. Patient not taking: Reported on 10/08/2021 07/23/21   [provider]  Multiple Vitamin (MULTIVITAMIN) capsule Take 1 capsule by mouth daily.    [provider]  oxyCODONE-acetaminophen (PERCOCET) 7.5-325 MG tablet Take 1 tablet by mouth 2 (two) times daily as needed for moderate pain. 10/06/21   [provider]  predniSONE (DELTASONE) 10 MG tablet Take 4 tablets (40 mg total) by mouth daily. 10/10/21   Drema Dallas, MD  sertraline (ZOLOFT) 50 MG tablet Take 1 tablet (50 mg total) by mouth 2 (two) times daily. 10/10/21   Drema Dallas, MD    Physical Exam: Vitals:   11/30/21 0545 11/30/21 0745 11/30/21 0800 11/30/21 0818  BP:  124/77 104/65 117/78  Pulse: 98 90 89 98  Resp: (!) 21 16 15 17   Temp:  99 F (37.2 C)    TempSrc:  Oral    SpO2: (!) 89% 92% 91% 94%  Weight:      Height:         Vitals:   11/30/21 0545 11/30/21 0745 11/30/21 0800 11/30/21 0818  BP:  124/77 104/65 117/78  Pulse: 98 90 89 98  Resp: (!) 21 16 15 17   Temp:  99 F (37.2 C)    TempSrc:  Oral    SpO2: (!) 89% 92% 91% 94%  Weight:      Height:      Constitutional: NAD, calm, comfortable Eyes: PERRL, lids and conjunctivae normal ENMT: Mucous membranes are moist. Posterior pharynx clear of any exudate or lesions.Normal dentition.  Neck: normal, supple, no masses, no thyromegaly Respiratory: +wheeze bilaterally, , no crackles. Normal respiratory effort. No accessory muscle use.  Cardiovascular: Regular rate and rhythm, no murmurs / rubs / gallops. No extremity edema. 2+ pedal pulses..  Abdomen: no tenderness, no masses palpated. No hepatosplenomegaly.  Bowel sounds positive.  Musculoskeletal: no clubbing / cyanosis. No joint deformity upper and lower extremities. Good ROM, no contractures. Normal muscle tone.  Skin: no rashes, lesions, ulcers. No induration Neurologic: CN 2-12 grossly intact. Sensation intact,Strength 5/5 in all 4.  Psychiatric: Normal judgment and insight. Alert and oriented x 3. Normal mood.    Labs on Admission: I have personally reviewed following labs and imaging studies  CBC: Recent Labs  Lab 11/30/21 0100  WBC 11.6*  NEUTROABS 5.1  HGB 13.9  HCT 43.2  MCV 91.9  PLT 347   Basic Metabolic Panel: Recent Labs  Lab 11/30/21 0100  NA 141  K 3.8  CL 109  CO2 26  GLUCOSE 108*  BUN 11  CREATININE 0.96  CALCIUM 8.7*   GFR: Estimated Creatinine Clearance: 56.2 mL/min (by C-G formula based on SCr of 0.96 mg/dL). Liver Function Tests: No results for input(s): "AST", "ALT", "ALKPHOS", "BILITOT", "PROT", "ALBUMIN" in the last 168 hours. No results for input(s): "LIPASE", "AMYLASE" in the last 168 hours. No results for input(s): "AMMONIA" in the last 168 hours. Coagulation Profile: No results for input(s): "INR", "PROTIME" in the last 168 hours. Cardiac Enzymes: No results for input(s): "CKTOTAL", "CKMB", "CKMBINDEX", "TROPONINI" in the last 168 hours. BNP (last 3 results) No results for input(s): "PROBNP" in the last 8760 hours. HbA1C: No results for input(s): "HGBA1C" in the last 72 hours. CBG: No results for input(s): "GLUCAP" in the last 168 hours. Lipid Profile: No results for input(s): "CHOL", "HDL", "LDLCALC", "TRIG", "CHOLHDL", "LDLDIRECT" in the last 72 hours. Thyroid Function Tests: No results for input(s): "TSH", "T4TOTAL", "FREET4", "T3FREE", "THYROIDAB" in the last 72 hours. Anemia Panel: No results for input(s): "VITAMINB12", "FOLATE", "FERRITIN", "TIBC", "IRON", "RETICCTPCT" in the last 72 hours. Urine analysis:    Component Value Date/Time   COLORURINE YELLOW 06/17/2020 0126    APPEARANCEUR HAZY (A) 06/17/2020 0126   LABSPEC 1.018 06/17/2020 0126   PHURINE 6.0 06/17/2020 0126   GLUCOSEU NEGATIVE 06/17/2020 0126   HGBUR NEGATIVE 06/17/2020 0126   BILIRUBINUR NEGATIVE 06/17/2020 0126   KETONESUR 5 (A) 06/17/2020 0126   PROTEINUR NEGATIVE 06/17/2020 0126   UROBILINOGEN 0.2 04/18/2014 1256   NITRITE POSITIVE (A) 06/17/2020 0126   LEUKOCYTESUR SMALL (A) 06/17/2020 0126    Radiological Exams on Admission: DG Chest 2 View  Result Date: 11/29/2021 CLINICAL DATA:  Shortness of breath EXAM: CHEST - 2 VIEW COMPARISON:  Chest x-ray 10/08/2021 FINDINGS: The heart size and mediastinal contours are within normal limits. Both lungs are clear. L1 compression deformity is chronic and unchanged. No acute fractures. IMPRESSION: No active cardiopulmonary disease. Electronically Signed   By: Darliss Cheney M.D.   On: 11/29/2021 23:54    EKG: Independently reviewed. See above  Assessment/Plan  Acute COPD exacerbation associated with acute hypoxic respiratory failure -patient had similar hospitalization a little over one month ago  - no formal diagnosis of COPD  -admit to progressive care  -continue on solumedrol bid , taper to prednisone per protocol  -ctx  -nebs standing and prn  - pulmonary consult to assist with optimizing outpatient medication   Leukocytosis -bacterial bronchitis /stress response -f/u with sputum sample  -on ctx per protocol   Abnormal EKG -hx of tobacco abuse , age  -cycle CE  -echo in am  -repeat ekg in am  Anxiety /depression/Chronic pain  -resume home regimen    Constipation -place on bowel regimen   CVA -continue secondary ppx     DVT prophylaxis: lovenox  Code Status: full  Family Communication: none at bedside Disposition Plan: patient  expected to be admitted greater than 2 midnights  Consults called: pulmonary  Admission status: progressive   Lurline Del MD [facility]   If 7PM-7AM, please contact  night-coverage [email or web address] Password [facility] [place]  11/30/2021, 8:35 AM

## 2021-11-30 NOTE — ED Notes (Signed)
Aspirin 325 mg not given. Patient had the Aspirin in her mouth and asked writer if it was 325 mg or 81 mg . Writer told the patient that it was 325 mg. Patient spit the Asprin out.

## 2021-11-30 NOTE — ED Provider Notes (Signed)
[facility] DEPT Provider Note   CSN: [id number] Arrival date & time: 11/29/21  2324     History  Chief Complaint  Patient presents with   Shortness of Breath    Lindsay Joyce is a 61 y.o. female.  Patient presents to the emergency department for evaluation of shortness of breath.  Patient reports a history of COPD.  She does not use oxygen at home, does use nebulizers.  Patient reports that she does check her oxygen saturations at home and today she was down at around 86%, briefly dropped into the 70s despite using nebulizers.  She has had some intermittent nonproductive cough.  No fever.       Home Medications Prior to Admission medications   Medication Sig Start Date End Date Taking? Authorizing Provider  albuterol (VENTOLIN HFA) 108 (90 Base) MCG/ACT inhaler Inhale 2 puffs into the lungs every 4 (four) hours as needed for wheezing or shortness of breath. 06/17/20   Larene Pickett, PA-C  ALPRAZolam Duanne Moron) 1 MG tablet Take 1-2 tablets by mouth 3 (three) times daily. 05/18/15   [provider]  aspirin EC 81 MG tablet Take 325 mg by mouth daily. Patient not taking: Reported on 10/08/2021    [provider]  B Complex-C (B-COMPLEX WITH VITAMIN C) tablet Take 1 tablet by mouth daily.    [provider]  Cholecalciferol (VITAMIN D3) 2000 units capsule Take 2,000 Units by mouth daily.    [provider]  dextromethorphan-guaiFENesin (MUCINEX DM) 30-600 MG 12hr tablet Take 1 tablet by mouth 2 (two) times daily. 10/10/21   Allie Bossier, MD  levothyroxine (SYNTHROID) 25 MCG tablet Take 25 mcg by mouth daily. Patient not taking: Reported on 10/08/2021 07/23/21   [provider]  Multiple Vitamin (MULTIVITAMIN) capsule Take 1 capsule by mouth daily.    [provider]  oxyCODONE-acetaminophen (PERCOCET) 7.5-325 MG tablet Take 1 tablet by mouth 2 (two) times daily as needed for moderate pain. 10/06/21    [provider]  predniSONE (DELTASONE) 10 MG tablet Take 4 tablets (40 mg total) by mouth daily. 10/10/21   Allie Bossier, MD  sertraline (ZOLOFT) 50 MG tablet Take 1 tablet (50 mg total) by mouth 2 (two) times daily. 10/10/21   Allie Bossier, MD      Allergies    Codeine, Diclofenac sodium, Gabapentin, Hydrocodone-acetaminophen, Hydromorphone, Meloxicam, Oxymorphone, Risperidone, Tramadol, and Morphine and related    Review of Systems   Review of Systems  Physical Exam Updated Vital Signs BP 122/79   Pulse 98   Temp (!) 97 F (36.1 C) (Oral)   Resp (!) 21   Ht 5\' 2"  (1.575 m)   Wt 64 kg   LMP 03/05/2008   SpO2 (!) 89%   BMI 25.79 kg/m  Physical Exam Vitals and nursing note reviewed.  Constitutional:      General: She is not in acute distress.    Appearance: She is well-developed.  HENT:     Head: Normocephalic and atraumatic.     Mouth/Throat:     Mouth: Mucous membranes are moist.  Eyes:     General: Vision grossly intact. Gaze aligned appropriately.     Extraocular Movements: Extraocular movements intact.     Conjunctiva/sclera: Conjunctivae normal.  Cardiovascular:     Rate and Rhythm: Normal rate and regular rhythm.     Pulses: Normal pulses.     Heart sounds: Normal heart sounds, S1 normal and S2 normal. No  murmur heard.    No friction rub. No gallop.  Pulmonary:     Effort: Tachypnea and accessory muscle usage present. No respiratory distress.     Breath sounds: Decreased breath sounds and wheezing present.  Abdominal:     General: Bowel sounds are normal.     Palpations: Abdomen is soft.     Tenderness: There is no abdominal tenderness. There is no guarding or rebound.     Hernia: No hernia is present.  Musculoskeletal:        General: No swelling.     Cervical back: Full passive range of motion without pain, normal range of motion and neck supple. No spinous process tenderness or muscular tenderness. Normal range of motion.     Right lower leg:  No edema.     Left lower leg: No edema.  Skin:    General: Skin is warm and dry.     Capillary Refill: Capillary refill takes less than 2 seconds.     Findings: No ecchymosis, erythema, rash or wound.  Neurological:     General: No focal deficit present.     Mental Status: She is alert and oriented to person, place, and time.     GCS: GCS eye subscore is 4. GCS verbal subscore is 5. GCS motor subscore is 6.     Cranial Nerves: Cranial nerves 2-12 are intact.     Sensory: Sensation is intact.     Motor: Motor function is intact.     Coordination: Coordination is intact.  Psychiatric:        Attention and Perception: Attention normal.        Mood and Affect: Mood normal.        Speech: Speech normal.        Behavior: Behavior normal.     ED Results / Procedures / Treatments   Labs (all labs ordered are listed, but only abnormal results are displayed) Labs Reviewed  CBC WITH DIFFERENTIAL/PLATELET - Abnormal; Notable for the following components:      Result Value   WBC 11.6 (*)    Eosinophils Absolute 1.9 (*)    All other components within normal limits  BASIC METABOLIC PANEL - Abnormal; Notable for the following components:   Glucose, Bld 108 (*)    Calcium 8.7 (*)    All other components within normal limits  SARS CORONAVIRUS 2 BY RT PCR    EKG EKG Interpretation  Date/Time:  Monday November 29 2021 23:35:39 EDT Ventricular Rate:  108 PR Interval:  162 QRS Duration: 64 QT Interval:  326 QTC Calculation: 436 R Axis:   4 Text Interpretation: No significant change since last tracing Sinus tachycardia Anteroseptal infarct , age undetermined Abnormal ECG Confirmed by Orpah Greek [phone]) on 11/30/2021 2:16:48 AM  Radiology DG Chest 2 View  Result Date: 11/29/2021 CLINICAL DATA:  Shortness of breath EXAM: CHEST - 2 VIEW COMPARISON:  Chest x-ray 10/08/2021 FINDINGS: The heart size and mediastinal contours are within normal limits. Both lungs are clear. L1  compression deformity is chronic and unchanged. No acute fractures. IMPRESSION: No active cardiopulmonary disease. Electronically Signed   By: Ronney Asters M.D.   On: 11/29/2021 23:54    Procedures Procedures    Medications Ordered in ED Medications  methylPREDNISolone sodium succinate (SOLU-MEDROL) 125 mg/2 mL injection 125 mg (125 mg Intravenous Given 11/30/21 0147)  albuterol (PROVENTIL) (2.5 MG/3ML) 0.083% nebulizer solution (10 mg/hr Nebulization Given 11/30/21 0159)  ipratropium (ATROVENT) nebulizer solution 0.5 mg (0.5  mg Nebulization Given 11/30/21 0159)    ED Course/ Medical Decision Making/ A&P                           Medical Decision Making Amount and/or Complexity of Data Reviewed Labs: ordered. Radiology: ordered.  Risk Prescription drug management.   Patient presents to the emergency department for evaluation of shortness of breath.  Patient does have history of COPD.  She reports increased cough, nonproductive.  Patient with significant wheezing, increased work of breathing at arrival.  Chest x-ray without evidence of pneumonia.  Patient administered Solu-Medrol, continuous bronchodilator therapy.  She continues to have increased work of breathing, shortness of breath.  Patient does not use oxygen at home.  She has been monitored in the department and her current oxygen saturation is 84% on room air.  She will require hospitalization for further management.  COVID swab negative.        Final Clinical Impression(s) / ED Diagnoses Final diagnoses:  COPD exacerbation [facility])    Rx / DC Orders ED Discharge Orders     None         Skyelyn Scruggs, Gwenyth Allegra, MD 11/30/21 [phone]

## 2021-12-01 LAB — COMPREHENSIVE METABOLIC PANEL
ALT: 15 U/L (ref 0–44)
AST: 24 U/L (ref 15–41)
Albumin: 3.6 g/dL (ref 3.5–5.0)
Alkaline Phosphatase: 19 U/L — ABNORMAL LOW (ref 38–126)
Anion gap: 6 (ref 5–15)
BUN: 19 mg/dL (ref 6–20)
CO2: 25 mmol/L (ref 22–32)
Calcium: 8.7 mg/dL — ABNORMAL LOW (ref 8.9–10.3)
Chloride: 110 mmol/L (ref 98–111)
Creatinine, Ser: 1.03 mg/dL — ABNORMAL HIGH (ref 0.44–1.00)
GFR, Estimated: >60 mL/min (ref >60)
Glucose, Bld: 122 mg/dL — ABNORMAL HIGH (ref 70–99)
Potassium: 4.1 mmol/L (ref 3.5–5.1)
Sodium: 141 mmol/L (ref 135–145)
Total Bilirubin: 0.3 mg/dL (ref 0.3–1.2)
Total Protein: 6.6 g/dL (ref 6.5–8.1)

## 2021-12-01 LAB — CBC
HCT: 38.2 % (ref 36.0–46.0)
Hemoglobin: 12.4 g/dL (ref 12.0–15.0)
MCH: 29.7 pg (ref 26.0–34.0)
MCHC: 32.5 g/dL (ref 30.0–36.0)
MCV: 91.6 fL (ref 80.0–100.0)
Platelets: 324 10*3/uL (ref 150–400)
RBC: 4.17 MIL/uL (ref 3.87–5.11)
RDW: 14.4 % (ref 11.5–15.5)
WBC: 8.3 10*3/uL (ref 4.0–10.5)
nRBC: 0 % (ref 0.0–0.2)

## 2021-12-01 MED ORDER — IPRATROPIUM-ALBUTEROL 0.5-2.5 (3) MG/3ML IN SOLN
3.0000 mL | Freq: Three times a day (TID) | RESPIRATORY_TRACT | Status: DC
  Administered 2021-12-02 – 2021-12-04 (×7): 3 mL via RESPIRATORY_TRACT
  Filled 2021-12-01 (×9): qty 3

## 2021-12-01 MED ORDER — ASPIRIN 81 MG PO TBEC
81.0000 mg | DELAYED_RELEASE_TABLET | Freq: Every day | ORAL | Status: DC
  Administered 2021-12-02 – 2021-12-04 (×3): 81 mg via ORAL
  Filled 2021-12-01 (×3): qty 1

## 2021-12-01 MED ORDER — DOXYCYCLINE HYCLATE 100 MG PO TABS
100.0000 mg | ORAL_TABLET | Freq: Two times a day (BID) | ORAL | Status: DC
  Administered 2021-12-01 – 2021-12-04 (×7): 100 mg via ORAL
  Filled 2021-12-01 (×7): qty 1

## 2021-12-01 NOTE — ED Notes (Signed)
Pt ambulated to BR with steady gait, NAD Noted, denies increase SOB. Pt hook back up to [place] 2 L after getting back in bed. Pt reposition for comfort, observed even RR and unlabored, side rails up x2 for safety, plan of care ongoing, pt expresses no needs or concerns at this time, call light within reach, no further concerns as of present. TV on for entertainment

## 2021-12-01 NOTE — ED Notes (Signed)
RT at the bedside to assess pt after breathing treatment

## 2021-12-01 NOTE — ED Notes (Signed)
Secure message sent to receiving RN Chloe Addison, awaiting response. Pt has an inpatient bed at this time

## 2021-12-01 NOTE — ED Notes (Signed)
Receiving RN Chloe Addison has agreed to accept TOC once pt has arrived to inpatient unit, all questions and concerns address.

## 2021-12-01 NOTE — Progress Notes (Signed)
PROGRESS NOTE  Lindsay Joyce  DOB: February 24, 1961  PCP: Lindsay Sacramento, MD TDH:[id number]  DOA: 11/29/2021  LOS: 1 day  Hospital Day: 3  Brief narrative: Lindsay Joyce is a 61 y.o. female with PMH significant for smoking, drug abuse, anxiety, depression, chronic pain, remote history of CVA, and possible undiagnosed COPD Patient presented to the ED on 9/25 with complaint of progressively worsening shortness of breath for several days despite using her nebulizers and a low O2 sat of 86% at home Last hospitalized 8/4 to 8/6 with acute hypoxic respiratory failure, presumed COPD exacerbation and was discharged on tapering course of steroids, Mucinex, nebulizers, incentive spirometry.  In the ED, patient was afebrile, heart rate in 90s blood pressure in normal range, breathing on 2 L oxygen by nasal cannula Labs with WC count 11.6, BMP unremarkable, respiratory panel negative Chest x-ray did not show any acute cardiopulmonary disease. EKG showed normal sinus rhythm but with pronounced ST-T wave changes compared to previous EKG Patient was treated with IV Solu-Medrol, DuoNeb Admitted to hospitalist service  Subjective: Patient was seen and examined this morning in the ED.  Pleasant middle-aged Caucasian female.  Propped up in bed.  On 2 L oxygen nasal cannula.  Mild scattered bilateral wheezing and cough. Chart reviewed Overnight remains afebrile, heart rate in 80s, blood pressure in normal range, breathing on 2 L  Assessment and plan: Acute hypoxic respiratory failure  Presumed COPD with acute exacerbation  Patient has more than 40-pack-year history of smoking smoking history.  Continues to smoke.  She says she is trying to cut down.  Although she does not have a clear diagnosis of COPD, I presume she has underlying COPD and is currently in exacerbation.  Similar presentation 8 weeks ago that improved with COPD regimen Currently on IV Rocephin, oral prednisone, DuoNeb.  I will also add 5-day  course of doxycycline. WBC count improved. Recommend pulmonary evaluation as an outpatient Recent Labs  Lab 11/30/21 0100 11/30/21 1039 12/01/21 0412  WBC 11.6* 4.9 8.3     Elevated troponin  Troponin trend as below.   EKG admission showed normal sinus rhythm with pronounced ST-T wave changes compared to previous EKG 9/26, echocardiogram with EF 70 to 75%, no wall motion abnormality, grade 1 diastolic dysfunction Recent Labs    11/30/21 1039 11/30/21 1316  TROPONINIHS 48* 38*   Anxiety /depression/Chronic pain  PTA on Zoloft 50 mg twice daily, Xanax 1 to 2 mg 3 times daily Continue the same    Constipation place on bowel regimen    CVA Continue aspirin 81 mg daily.  L1 compression deformity  chronic and unchanged. Not in pain currently   Goals of care   Code Status: Full Code    Mobility: Encourage ambulation  Skin assessment:     Nutritional status:  Body mass index is 25.79 kg/m.          Diet:  Diet Order             Diet Heart Room service appropriate? Yes; Fluid consistency: Thin  Diet effective now                   DVT prophylaxis:  enoxaparin (LOVENOX) injection 40 mg Start: 11/30/21 1000   Antimicrobials: IV Rocephin, doxycycline Fluid: None Consultants: None Family Communication: None at bedside  Status is: Inpatient  Continue in-hospital care because: Needs IV antibiotics for 1 more day Level of care: Progressive   Dispo: The patient is from: Home  Anticipated d/c is to: Home hopefully in 1 to 2 days              Patient currently is not medically stable to d/c.   Difficult to place patient No     Infusions:   cefTRIAXone (ROCEPHIN)  IV Stopped (12/01/21 1010)    Scheduled Meds:  ALPRAZolam  2 mg Oral TID   [START ON 12/02/2021] aspirin EC  81 mg Oral Daily   doxycycline  100 mg Oral Q12H   enoxaparin (LOVENOX) injection  40 mg Subcutaneous Daily   ipratropium-albuterol  3 mL Nebulization Q6H    polyethylene glycol  17 g Oral Daily   predniSONE  40 mg Oral Q breakfast   sertraline  50 mg Oral BID    PRN meds: albuterol, ondansetron **OR** ondansetron (ZOFRAN) IV, oxyCODONE-acetaminophen   Antimicrobials: Anti-infectives (From admission, onward)    Start     Dose/Rate Route Frequency Ordered Stop   12/01/21 1000  doxycycline (VIBRA-TABS) tablet 100 mg        100 mg Oral Every 12 hours 12/01/21 0818     11/30/21 1000  cefTRIAXone (ROCEPHIN) 1 g in sodium chloride 0.9 % 100 mL IVPB        1 g 200 mL/hr over 30 Minutes Intravenous Daily 11/30/21 0907 12/05/21 0959       Objective: Vitals:   12/01/21 1130 12/01/21 1136  BP: 103/70   Pulse: 90   Resp: 13   Temp:  98.4 F (36.9 C)  SpO2: 95%     Intake/Output Summary (Last 24 hours) at 12/01/2021 1403 Last data filed at 12/01/2021 1010 Gross per 24 hour  Intake 100 ml  Output --  Net 100 ml   Filed Weights   11/29/21 2340  Weight: 64 kg   Weight change:  Body mass index is 25.79 kg/m.   Physical Exam: General exam: Pleasant, middle-aged Caucasian female.  Not in pain Skin: No rashes, lesions or ulcers. HEENT: Atraumatic, normocephalic, no obvious bleeding Lungs: Scattered bilateral wheezing, and cough CVS: Regular rate and rhythm, no murmur GI/Abd soft, nontender, nondistended, bowel sound present CNS: Alert, awake, oriented x3 Psychiatry: Mood appropriate Extremities: No pedal edema, no calf tenderness  Data Review: I have personally reviewed the laboratory data and studies available.  F/u labs ordered Unresulted Labs (From admission, onward)     Start     Ordered   12/07/21 0500  Creatinine, serum  (enoxaparin (LOVENOX)    CrCl < 30 ml/min)  Once,   R       Comments: while on enoxaparin therapy.    11/30/21 0907   11/30/21 0904  CBC  (enoxaparin (LOVENOX)    CrCl < 30 ml/min)  Once,   R       Comments: Baseline for enoxaparin therapy IF NOT ALREADY DRAWN.  Notify MD if PLT < 100 K.    11/30/21  0907   11/30/21 0902  Expectorated Sputum Assessment w Gram Stain, Rflx to Resp Cult  (COPD / Pneumonia / Cellulitis / Lower Extremity Wound)  Once,   R        11/30/21 [id number]            Signed, Terrilee Croak, MD [facility] 12/01/2021

## 2021-12-02 ENCOUNTER — Inpatient Hospital Stay (HOSPITAL_COMMUNITY): Payer: Self-pay

## 2021-12-02 DIAGNOSIS — I5031 Acute diastolic (congestive) heart failure: Secondary | ICD-10-CM | POA: Diagnosis present

## 2021-12-02 DIAGNOSIS — F418 Other specified anxiety disorders: Secondary | ICD-10-CM

## 2021-12-02 DIAGNOSIS — J441 Chronic obstructive pulmonary disease with (acute) exacerbation: Secondary | ICD-10-CM | POA: Diagnosis present

## 2021-12-02 DIAGNOSIS — S32010A Wedge compression fracture of first lumbar vertebra, initial encounter for closed fracture: Secondary | ICD-10-CM

## 2021-12-02 DIAGNOSIS — Z720 Tobacco use: Secondary | ICD-10-CM

## 2021-12-02 LAB — RAPID URINE DRUG SCREEN, HOSP PERFORMED
Amphetamines: NOT DETECTED
Barbiturates: NOT DETECTED
Benzodiazepines: POSITIVE — AB
Cocaine: NOT DETECTED
Opiates: NOT DETECTED
Tetrahydrocannabinol: POSITIVE — AB

## 2021-12-02 MED ORDER — GUAIFENESIN-DM 100-10 MG/5ML PO SYRP
5.0000 mL | ORAL_SOLUTION | ORAL | Status: DC | PRN
  Administered 2021-12-02 – 2021-12-04 (×3): 5 mL via ORAL
  Filled 2021-12-02 (×3): qty 10

## 2021-12-02 MED ORDER — HEPARIN SODIUM (PORCINE) 5000 UNIT/ML IJ SOLN
5000.0000 [IU] | Freq: Three times a day (TID) | INTRAMUSCULAR | Status: DC
  Administered 2021-12-03 – 2021-12-04 (×4): 5000 [IU] via SUBCUTANEOUS
  Filled 2021-12-02 (×4): qty 1

## 2021-12-02 NOTE — Progress Notes (Signed)
SATURATION QUALIFICATIONS: (This note is used to comply with regulatory documentation for home oxygen)  Patient Saturations on Room Air at Rest = 96%  Patient Saturations on Room Air while Ambulating = 93%  Patient Saturations on 0 Liters of oxygen while Ambulating = 93%  Please briefly explain why patient needs home oxygen:  Pt does not meet the requirement for home oxygen. Maintained saturation>93% on room air while ambulating.

## 2021-12-02 NOTE — Plan of Care (Signed)
  Problem: Activity: Goal: Ability to tolerate increased activity will improve Outcome: Progressing   Problem: Respiratory: Goal: Ability to maintain a clear airway will improve Outcome: Progressing Goal: Ability to maintain adequate ventilation will improve Outcome: Progressing   Problem: Clinical Measurements: Goal: Diagnostic test results will improve Outcome: Progressing

## 2021-12-02 NOTE — Plan of Care (Signed)
  Problem: Respiratory: Goal: Ability to maintain adequate ventilation will improve Outcome: Progressing   Problem: Safety: Goal: Ability to remain free from injury will improve Outcome: Progressing

## 2021-12-02 NOTE — Progress Notes (Signed)
Mobility Specialist - Progress Note  Pre-mobility: 73 bpm HR, 97% SpO2 During mobility: 111 bpm HR, 91% SpO2 Post-mobility: 67 bpm HR, 95% SPO2  12/02/21 1148  Oxygen Therapy  O2 Device Nasal Cannula  O2 Flow Rate (L/min) 2 L/min  Mobility  HOB Elevated/Bed Position Self regulated  Activity Ambulated independently in hallway  Range of Motion/Exercises Active  Level of Assistance Modified independent, requires aide device or extra time  Assistive Device None  Distance Ambulated (ft) 300 ft  Activity Response Tolerated well  $Mobility charge 1 Mobility   Pt was found in bed and agreeable to ambulate. Pt had no complaints and at EOS returned to bed with all necessities in reach. RN was notified of session.  Ferd Hibbs Mobility Specialist

## 2021-12-02 NOTE — Progress Notes (Signed)
Lindsay Joyce [id number] DOB: [birth year] DOA: 11/29/2021 PCP: [name], MD   Subj: Lindsay Joyce is a 61 y.o. WF PMHx tobacco abuse, Drug abuse, Anxiety, Depression, Chronic pain, remote history of CVA, and possible undiagnosed COPD  Presented to the ED on 9/25 with complaint of progressively worsening shortness of breath for several days despite using her nebulizers and a low O2 sat of 86% at home Last hospitalized 8/4 to 8/6 with acute hypoxic respiratory failure, presumed COPD exacerbation and was discharged on tapering course of steroids, Mucinex, nebulizers, incentive spirometry.   In the ED, patient was afebrile, heart rate in 90s blood pressure in normal range, breathing on 2 L oxygen by nasal cannula Labs with WC count 11.6, BMP unremarkable, respiratory panel negative Chest x-ray did not show any acute cardiopulmonary disease. EKG showed normal sinus rhythm but with pronounced ST-T wave changes compared to previous EKG Patient was treated with IV Solu-Medrol, DuoNeb Admitted to hospitalist service   Obj: 9/28 A/O x4,   Objective: VITAL SIGNS: Temp: 98.4 F (36.9 C) (09/28 0605) Temp Source: Oral (09/28 0605) BP: 123/80 (09/28 0605) Pulse Rate: 79 (09/28 0605) SPO2; FIO2:   Intake/Output Summary (Last 24 hours) at 12/02/2021 0950 Last data filed at 12/01/2021 2200 Gross per 24 hour  Intake 340 ml  Output --  Net 340 ml     Exam: General: A/O x4, positive acute respiratory distress Lungs: Clear to auscultation bilaterally, positive mild expiratory wheezing bilateral, negative crackles Cardiovascular: Regular rate and rhythm without murmur gallop or rub normal S1 and S2 Abdomen: Nontender, nondistended, soft, bowel sounds positive, no rebound, no ascites, no appreciable mass Extremities: No significant cyanosis, clubbing, or edema bilateral lower extremities Skin: Negative rashes, lesions, ulcers Psychiatric:  Negative depression, negative anxiety,  negative fatigue, negative mania  Central nervous system:  Cranial nerves II through XII intact, tongue/uvula midline, all extremities muscle strength 5/5, sensation intact throughout, negative dysarthria, negative expressive aphasia, negative receptive aphasia.   Mobility Assessment (last 72 hours)     Mobility Assessment     Row Name 12/01/21 2137           Does patient have an order for bedrest or is patient medically unstable No - Continue assessment       What is the highest level of mobility based on the progressive mobility assessment? Level 6 (Walks independently in room and hall) - Balance while walking in room without assist - Complete                  DVT prophylaxis: Subcu heparin Code Status: Full Family Communication:  Status is: Inpatient    Dispo: The patient is from: Home              Anticipated d/c is to: Home              Anticipated d/c date is: 2 days              Patient currently is not medically stable to d/c.    Procedures/Significant Events: 9/28 CT chest W0 contrast -Pulmonary emphysema without acute finding. -Chronic L1 vertebral compression deformity  Consultants:     Cultures   Antimicrobials: Anti-infectives (From admission, onward)    Start     Dose/Rate Route Frequency Ordered Stop   12/01/21 1000  doxycycline (VIBRA-TABS) tablet 100 mg        100 mg Oral Every 12 hours 12/01/21 0818     11/30/21 1000  cefTRIAXone (  ROCEPHIN) 1 g in sodium chloride 0.9 % 100 mL IVPB        1 g 200 mL/hr over 30 Minutes Intravenous Daily 11/30/21 0907 12/05/21 0959        A/P Acute Respiratory Failure with Hypoxia/COPD exacerbation -COPD with acute exacerbation.  See CT chest below -40-pack-year history of smoking smoking history.  Continues to smoke.  She says she is trying to cut down.  Similar presentation 8 weeks ago that improved with COPD regimen -Currently on IV Rocephin -Oral prednisone,  -DuoNeb.   -5-day course of  doxycycline. -Recommend pulmonary evaluation as an outpatient Last Labs        Recent Labs  Lab 11/30/21 0100 11/30/21 1039 12/01/21 0412  WBC 11.6* 4.9 8.3     -9/28 Per ATS guidelines patient rates CT chest given she is a high risk patient.  Consistent with COPD - Flutter valve - Incentive spirometry - DuoNeb TID -Mucinex DM -Prednisone 40 mg daily -9/28 ambulatory SPO2 pending  Tobacco abuse -9/28 TOC consult resources for stopping tobacco and marijuana use  Drug abuse - 9/28 urine rapid drug screen pending.  In the past positive for [facility]  Elevated troponin  -Troponin trend as below.   -EKG admission showed normal sinus rhythm with pronounced ST-T wave changes compared to previous EKG -9/26, echocardiogram with EF 70 to 75%, no wall motion abnormality, grade 1 diastolic dysfunction Recent Labs (last 2 labs)       Recent Labs    11/30/21 1039 11/30/21 1316  TROPONINIHS 48* 38*      Acute Diastolic CHF - Strict in and out - Daily weight - Upon discharge we will need to establish care with cardiologist acute diastolic CHF  Anxiety /depression/Chronic pain  -PTA on Zoloft 50 mg twice daily,  -Xanax 1 to 2 mg 3 times daily     Constipation place on bowel regimen    CVA Continue aspirin 81 mg daily.   L1 compression deformity  chronic and unchanged. Not in pain currently    Goals of care   Code Status: Full Code          Care during the described time interval was provided by me .  I have reviewed this patient's available data, including medical history, events of note, physical examination, and all test results as part of my evaluation.

## 2021-12-03 LAB — COMPREHENSIVE METABOLIC PANEL
ALT: 18 U/L (ref 0–44)
AST: 17 U/L (ref 15–41)
Albumin: 3.6 g/dL (ref 3.5–5.0)
Alkaline Phosphatase: 17 U/L — ABNORMAL LOW (ref 38–126)
Anion gap: 5 (ref 5–15)
BUN: 21 mg/dL — ABNORMAL HIGH (ref 6–20)
CO2: 29 mmol/L (ref 22–32)
Calcium: 8.4 mg/dL — ABNORMAL LOW (ref 8.9–10.3)
Chloride: 107 mmol/L (ref 98–111)
Creatinine, Ser: 1.12 mg/dL — ABNORMAL HIGH (ref 0.44–1.00)
GFR, Estimated: 57 mL/min — ABNORMAL LOW (ref >60)
Glucose, Bld: 91 mg/dL (ref 70–99)
Potassium: 3.4 mmol/L — ABNORMAL LOW (ref 3.5–5.1)
Sodium: 141 mmol/L (ref 135–145)
Total Bilirubin: 0.6 mg/dL (ref 0.3–1.2)
Total Protein: 6 g/dL — ABNORMAL LOW (ref 6.5–8.1)

## 2021-12-03 LAB — CBC WITH DIFFERENTIAL/PLATELET
Abs Immature Granulocytes: 0.03 10*3/uL (ref 0.00–0.07)
Basophils Absolute: 0.1 10*3/uL (ref 0.0–0.1)
Basophils Relative: 1 %
Eosinophils Absolute: 0.4 10*3/uL (ref 0.0–0.5)
Eosinophils Relative: 4 %
HCT: 37.6 % (ref 36.0–46.0)
Hemoglobin: 12.1 g/dL (ref 12.0–15.0)
Immature Granulocytes: 0 %
Lymphocytes Relative: 55 %
Lymphs Abs: 5.7 10*3/uL — ABNORMAL HIGH (ref 0.7–4.0)
MCH: 29.7 pg (ref 26.0–34.0)
MCHC: 32.2 g/dL (ref 30.0–36.0)
MCV: 92.4 fL (ref 80.0–100.0)
Monocytes Absolute: 0.5 10*3/uL (ref 0.1–1.0)
Monocytes Relative: 5 %
Neutro Abs: 3.6 10*3/uL (ref 1.7–7.7)
Neutrophils Relative %: 35 %
Platelets: 345 10*3/uL (ref 150–400)
RBC: 4.07 MIL/uL (ref 3.87–5.11)
RDW: 14 % (ref 11.5–15.5)
WBC: 10.3 10*3/uL (ref 4.0–10.5)
nRBC: 0 % (ref 0.0–0.2)

## 2021-12-03 LAB — EXPECTORATED SPUTUM ASSESSMENT W GRAM STAIN, RFLX TO RESP C

## 2021-12-03 LAB — MAGNESIUM: Magnesium: 1.9 mg/dL (ref 1.7–2.4)

## 2021-12-03 LAB — PHOSPHORUS: Phosphorus: 5.3 mg/dL — ABNORMAL HIGH (ref 2.5–4.6)

## 2021-12-03 MED ORDER — PNEUMOCOCCAL VAC POLYVALENT 25 MCG/0.5ML IJ INJ
0.5000 mL | INJECTION | INTRAMUSCULAR | Status: AC
  Administered 2021-12-04: 0.5 mL via INTRAMUSCULAR
  Filled 2021-12-03: qty 0.5

## 2021-12-03 MED ORDER — POTASSIUM CHLORIDE CRYS ER 20 MEQ PO TBCR
40.0000 meq | EXTENDED_RELEASE_TABLET | Freq: Two times a day (BID) | ORAL | Status: AC
  Administered 2021-12-03 – 2021-12-04 (×2): 40 meq via ORAL
  Filled 2021-12-03 (×2): qty 2

## 2021-12-03 MED ORDER — ALBUTEROL SULFATE (2.5 MG/3ML) 0.083% IN NEBU: 2.5000 mg | INHALATION_SOLUTION | Freq: Four times a day (QID) | RESPIRATORY_TRACT | Status: DC | PRN

## 2021-12-03 MED ORDER — CEFDINIR 300 MG PO CAPS
300.0000 mg | ORAL_CAPSULE | Freq: Two times a day (BID) | ORAL | Status: DC
  Administered 2021-12-04: 300 mg via ORAL
  Filled 2021-12-03: qty 1

## 2021-12-03 MED ORDER — UMECLIDINIUM BROMIDE 62.5 MCG/ACT IN AEPB
1.0000 | INHALATION_SPRAY | Freq: Every day | RESPIRATORY_TRACT | Status: DC
  Administered 2021-12-04: 1 via RESPIRATORY_TRACT
  Filled 2021-12-03: qty 7

## 2021-12-03 MED ORDER — INFLUENZA VAC SPLIT QUAD 0.5 ML IM SUSY
0.5000 mL | PREFILLED_SYRINGE | INTRAMUSCULAR | Status: AC
  Administered 2021-12-04: 0.5 mL via INTRAMUSCULAR
  Filled 2021-12-03: qty 0.5

## 2021-12-03 MED ORDER — TIOTROPIUM BROMIDE MONOHYDRATE 18 MCG IN CAPS: 18.0000 ug | ORAL_CAPSULE | Freq: Every day | RESPIRATORY_TRACT | Status: DC

## 2021-12-03 NOTE — Progress Notes (Signed)
Lindsay Joyce [id number] DOB: 09-Jan-1961 DOA: 11/29/2021 PCP: [name], MD   Subj: Lindsay Joyce is a 61 y.o. WF PMHx tobacco abuse, Drug abuse, Anxiety, Depression, Chronic pain, remote history of CVA, and possible undiagnosed COPD  Presented to the ED on 9/25 with complaint of progressively worsening shortness of breath for several days despite using her nebulizers and a low O2 sat of 86% at home Last hospitalized 8/4 to 8/6 with acute hypoxic respiratory failure, presumed COPD exacerbation and was discharged on tapering course of steroids, Mucinex, nebulizers, incentive spirometry.   In the ED, patient was afebrile, heart rate in 90s blood pressure in normal range, breathing on 2 L oxygen by nasal cannula Labs with WC count 11.6, BMP unremarkable, respiratory panel negative Chest x-ray did not show any acute cardiopulmonary disease. EKG showed normal sinus rhythm but with pronounced ST-T wave changes compared to previous EKG Patient was treated with IV Solu-Medrol, DuoNeb Admitted to hospitalist service   Obj: 9/29 afebrile overnight A/O x4, negative CP minimal normal SOB.     Objective: VITAL SIGNS: Temp: 98.8 F (37.1 C) (09/29 1233) Temp Source: Oral (09/29 1233) BP: 143/86 (09/29 1233) Pulse Rate: 72 (09/29 1233) SPO2; 93% FIO2: Room air   Intake/Output Summary (Last 24 hours) at 12/03/2021 1550 Last data filed at 12/03/2021 1401 Gross per 24 hour  Intake 560 ml  Output --  Net 560 ml      Exam: General: A/O x4, positive acute respiratory distress Lungs: Clear to auscultation bilaterally, positive mild expiratory wheezing bilateral, negative crackles Cardiovascular: Regular rate and rhythm without murmur gallop or rub normal S1 and S2 Abdomen: Nontender, nondistended, soft, bowel sounds positive, no rebound, no ascites, no appreciable mass Extremities: No significant cyanosis, clubbing, or edema bilateral lower extremities Skin: Negative rashes,  lesions, ulcers Psychiatric:  Negative depression, negative anxiety, negative fatigue, negative mania  Central nervous system:  Cranial nerves II through XII intact, tongue/uvula midline, all extremities muscle strength 5/5, sensation intact throughout, negative dysarthria, negative expressive aphasia, negative receptive aphasia.   Mobility Assessment (last 72 hours)     Mobility Assessment     Row Name 12/03/21 0830 12/02/21 0830 12/01/21 2137       Does patient have an order for bedrest or is patient medically unstable No - Continue assessment No - Continue assessment No - Continue assessment     What is the highest level of mobility based on the progressive mobility assessment? Level 6 (Walks independently in room and hall) - Balance while walking in room without assist - Complete Level 6 (Walks independently in room and hall) - Balance while walking in room without assist - Complete Level 6 (Walks independently in room and hall) - Balance while walking in room without assist - Complete                DVT prophylaxis: Subcu heparin Code Status: Full Family Communication: 9/29 son at bedside for discussion of plan of care all questions answered Status is: Inpatient    Dispo: The patient is from: Home              Anticipated d/c is to: Home              Anticipated d/c date is: 1 day              Patient currently is not medically stable to d/c.    Procedures/Significant Events: 9/28 CT chest W0 contrast -Pulmonary emphysema without acute finding. -  Chronic L1 vertebral compression deformity    Consultants:     Cultures   Antimicrobials: Anti-infectives (From admission, onward)    Start     Dose/Rate Route Frequency Ordered Stop   12/01/21 1000  doxycycline (VIBRA-TABS) tablet 100 mg        100 mg Oral Every 12 hours 12/01/21 0818 12/06/21 0959   11/30/21 1000  cefTRIAXone (ROCEPHIN) 1 g in sodium chloride 0.9 % 100 mL IVPB        1 g 200 mL/hr over 30  Minutes Intravenous Daily 11/30/21 0907 12/05/21 0959        A/P Acute Respiratory Failure with Hypoxia/COPD exacerbation -COPD with acute exacerbation.  See CT chest below -40-pack-year history of smoking smoking history.  Continues to smoke.  She says she is trying to cut down.  Similar presentation 8 weeks ago that improved with COPD regimen - Complete 7-day course IV Rocephin--> Cefdinir -Oral prednisone,  -DuoNeb.   -7-day course of Doxycycline. -Recommend pulmonary evaluation as an outpatient Last Labs        Recent Labs  Lab 11/30/21 0100 11/30/21 1039 12/01/21 0412  WBC 11.6* 4.9 8.3     -9/28 Per ATS guidelines patient rates CT chest given she is a high risk patient.  Consistent with COPD - Flutter valve - Incentive spirometry - DuoNeb TID -Mucinex DM -Prednisone 40 mg daily SATURATION QUALIFICATIONS: (This note is used to comply with regulatory documentation for home oxygen) Patient Saturations on Room Air at Rest = 96% Patient Saturations on Room Air while Ambulating = 93% Patient Saturations on 0 Liters of oxygen while Ambulating = 93% Please briefly explain why patient needs home oxygen: -Does not meet requirements for home O2 -High risk patient will require flu vaccination prior to discharge - High risk patient will require pneumonia vaccination prior to discharge - High risk patient upon follow-up with PCP will require RSV vaccination, in addition schedule establish care with pulmonologist for spirometry, DLCO. -Since patient having acute exacerbation currently unable to obtain COPD Assessment Test.  (CAT score) - Will assign patient to group C for maintenance treatment of COPD given that she has had> 1 hospitalization in the last 2 months. -9/29 Spiriva 18 mcg daily - 9/29 Albuterol PRN rescue inhaler - 9/29 DuoNeb PRN severe rescue inhaler when Albuterol ineffective - 9/29 and 1 to 2 weeks follow-up with PCP, will need to   Tobacco abuse -9/28 [facility]  consult resources for stopping tobacco and marijuana use  Drug abuse - 9/28 urine rapid drug positive for THC -9/29 TOC consult resources for drug abuse.  Elevated troponin  -Troponin trend as below.   -EKG admission showed normal sinus rhythm with pronounced ST-T wave changes compared to previous EKG -9/26, echocardiogram with EF 70 to 75%, no wall motion abnormality, grade 1 diastolic dysfunction Recent Labs (last 2 labs)       Recent Labs    11/30/21 1039 11/30/21 1316  TROPONINIHS 48* 38*      Acute Diastolic CHF - Strict in and out - Daily weight - Upon discharge we will need to establish care with cardiologist acute diastolic CHF  Anxiety /depression/Chronic pain  -PTA on Zoloft 50 mg twice daily,  -Xanax 1 to 2 mg 3 times daily     Constipation place on bowel regimen    CVA Continue aspirin 81 mg daily.   L1 compression deformity  chronic and unchanged. Not in pain currently   Hypokalemia - Potassium goal> 4 - 9/29 K-Dur  40 mEq BID    Goals of care   Code Status: Full Code          Care during the described time interval was provided by me .  I have reviewed this patient's available data, including medical history, events of note, physical examination, and all test results as part of my evaluation.

## 2021-12-03 NOTE — Progress Notes (Signed)
Mobility Specialist - Progress Note   12/03/21 1633  Mobility  HOB Elevated/Bed Position Self regulated  Activity Ambulated independently in hallway  Range of Motion/Exercises Active  Level of Assistance Independent  Assistive Device None  Distance Ambulated (ft) 350 ft  Activity Response Tolerated well  $Mobility charge 1 Mobility   Pt was found in bed and agreeable to ambulate. Had no complaints and at EOS returned to bed with all necessities in reach.  Ferd Hibbs Mobility Specialist

## 2021-12-04 DIAGNOSIS — F1910 Other psychoactive substance abuse, uncomplicated: Secondary | ICD-10-CM

## 2021-12-04 LAB — COMPREHENSIVE METABOLIC PANEL
ALT: 18 U/L (ref 0–44)
AST: 16 U/L (ref 15–41)
Albumin: 3.4 g/dL — ABNORMAL LOW (ref 3.5–5.0)
Alkaline Phosphatase: 16 U/L — ABNORMAL LOW (ref 38–126)
Anion gap: 6 (ref 5–15)
BUN: 22 mg/dL — ABNORMAL HIGH (ref 6–20)
CO2: 27 mmol/L (ref 22–32)
Calcium: 8.6 mg/dL — ABNORMAL LOW (ref 8.9–10.3)
Chloride: 108 mmol/L (ref 98–111)
Creatinine, Ser: 1.03 mg/dL — ABNORMAL HIGH (ref 0.44–1.00)
GFR, Estimated: >60 mL/min (ref >60)
Glucose, Bld: 82 mg/dL (ref 70–99)
Potassium: 3.8 mmol/L (ref 3.5–5.1)
Sodium: 141 mmol/L (ref 135–145)
Total Bilirubin: 0.5 mg/dL (ref 0.3–1.2)
Total Protein: 6 g/dL — ABNORMAL LOW (ref 6.5–8.1)

## 2021-12-04 LAB — CBC WITH DIFFERENTIAL/PLATELET
Abs Immature Granulocytes: 0.03 10*3/uL (ref 0.00–0.07)
Basophils Absolute: 0.1 10*3/uL (ref 0.0–0.1)
Basophils Relative: 1 %
Eosinophils Absolute: 0.4 10*3/uL (ref 0.0–0.5)
Eosinophils Relative: 3 %
HCT: 37.8 % (ref 36.0–46.0)
Hemoglobin: 12.1 g/dL (ref 12.0–15.0)
Immature Granulocytes: 0 %
Lymphocytes Relative: 54 %
Lymphs Abs: 5.7 10*3/uL — ABNORMAL HIGH (ref 0.7–4.0)
MCH: 29.3 pg (ref 26.0–34.0)
MCHC: 32 g/dL (ref 30.0–36.0)
MCV: 91.5 fL (ref 80.0–100.0)
Monocytes Absolute: 0.4 10*3/uL (ref 0.1–1.0)
Monocytes Relative: 4 %
Neutro Abs: 4 10*3/uL (ref 1.7–7.7)
Neutrophils Relative %: 38 %
Platelets: 364 10*3/uL (ref 150–400)
RBC: 4.13 MIL/uL (ref 3.87–5.11)
RDW: 13.9 % (ref 11.5–15.5)
WBC: 10.6 10*3/uL — ABNORMAL HIGH (ref 4.0–10.5)
nRBC: 0 % (ref 0.0–0.2)

## 2021-12-04 LAB — PHOSPHORUS: Phosphorus: 4.8 mg/dL — ABNORMAL HIGH (ref 2.5–4.6)

## 2021-12-04 LAB — MAGNESIUM: Magnesium: 1.9 mg/dL (ref 1.7–2.4)

## 2021-12-04 MED ORDER — IPRATROPIUM-ALBUTEROL 0.5-2.5 (3) MG/3ML IN SOLN: 3.0000 mL | Freq: Four times a day (QID) | RESPIRATORY_TRACT | 0 refills | Status: AC | PRN

## 2021-12-04 MED ORDER — UMECLIDINIUM BROMIDE 62.5 MCG/ACT IN AEPB: 1.0000 | INHALATION_SPRAY | Freq: Every day | RESPIRATORY_TRACT | 0 refills | Status: AC

## 2021-12-04 MED ORDER — CEFDINIR 300 MG PO CAPS: 300.0000 mg | ORAL_CAPSULE | Freq: Two times a day (BID) | ORAL | 0 refills | Status: AC

## 2021-12-04 MED ORDER — DOXYCYCLINE HYCLATE 100 MG PO TABS: 100.0000 mg | ORAL_TABLET | Freq: Two times a day (BID) | ORAL | 0 refills | Status: AC

## 2021-12-04 NOTE — TOC Transition Note (Signed)
Transition of Care [facility]) - CM/SW Discharge Note   Patient Details  Name: Lindsay Joyce MRN: [id number] Date of Birth: 1960-10-02  [facility] of [facility]) CM/SW Contact:  Kimber Relic, LCSW Phone Number: 12/04/2021, 12:57 PM   Clinical Narrative:    Dr. Sherral Hammers was informed that pt's ambulatory o2 dropped and she is now meeting criteria for hh o2. This CSW contacted [place] and informed that the pt is uninsured and is needing o2. [employer] will file this under charity but stated they would reach out to the pt to at least have a card on file. MD and RN notified. This CSW attached resources to the pt's d/c summary to address food insecurity and utility needs. Pt to be transported home by spouse. TOC signing off.    Final next level of care: [place] Barriers to Discharge: Inadequate or no insurance   Patient Goals and CMS Choice Patient states their goals for this hospitalization and ongoing recovery are:: return home      Discharge Placement                       Discharge Plan and Services In-house Referral: Clinical Social Work Discharge Planning Services: Other - See comment (Charity through [place] for [facility] o2) Post Acute Care Choice: Home Health          DME Arranged: Oxygen DME Agency: [employer] Date DME Agency Contacted: 12/04/21 Time DME Agency Contacted: 1215 Representative spoke with at DME Agency: [place] ([place]) Interventions     Readmission Risk Interventions     No data to display

## 2021-12-04 NOTE — Progress Notes (Signed)
SATURATION QUALIFICATIONS: (This note is used to comply with regulatory documentation for home oxygen)  Patient Saturations on Room Air at Rest = 93%  Patient Saturations on Room Air while Ambulating = 88%  Patient Saturations on 2 Liters of oxygen while Ambulating = 95%  Please briefly explain why patient needs home oxygen: desaturating with ambulation
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
## 2021-12-04 NOTE — Progress Notes (Addendum)
AVS given to patient and explained at the bedside. Medications and follow up appointments have been explained with pt verbalizing understanding. O2 delivered at bedside for transport.

## 2021-12-04 NOTE — Progress Notes (Signed)
Mobility Specialist - Progress Note   12/04/21 1452  Mobility  HOB Elevated/Bed Position Self regulated  Activity Ambulated independently in hallway  Range of Motion/Exercises Active  Level of Assistance Independent  Assistive Device None  Distance Ambulated (ft) 125 ft  Activity Response Tolerated well  $Mobility charge 1 Mobility   Pt was found in room and agreeable to ambulate. Had no complaints and at EOS returned to room with all necessities in reach.  Ferd Hibbs Mobility Specialist

## 2021-12-04 NOTE — Discharge Summary (Signed)
Physician Discharge Summary  Lindsay Joyce [id number] DOB: 1961/01/02 DOA: 11/29/2021  PCP: [name], MD  Admit date: 11/29/2021 Discharge date: 12/04/2021  Time spent: 35 minutes  Recommendations for Outpatient Follow-up:   Acute Respiratory Failure with Hypoxia/COPD exacerbation -COPD with acute exacerbation.  See CT chest below -40-pack-year history of smoking smoking history.  Continues to smoke.  She says she is trying to cut down.  Similar presentation 8 weeks ago that improved with COPD regimen - Complete 7-day course IV Rocephin--> Cefdinir -Oral prednisone,  -DuoNeb.   -7-day course of Doxycycline. -Recommend pulmonary evaluation as an outpatient Lab Results  Component Value Date   WBC 10.6 (H) 12/04/2021   WBC 10.3 12/03/2021   WBC 8.3 12/01/2021   WBC 4.9 11/30/2021   WBC 11.6 (H) 11/30/2021  -9/28 Per ATS guidelines patient rates CT chest given she is a high risk patient.  Consistent with COPD - Flutter valve - Incentive spirometry - DuoNeb TID -Mucinex DM -Prednisone 40 mg daily SATURATION QUALIFICATIONS: (This note is used to comply with regulatory documentation for home oxygen) Patient Saturations on Room Air at Rest = 93% Patient Saturations on Room Air while Ambulating = 88% Patient Saturations on 2 Liters of oxygen while Ambulating = 95% Please briefly explain why patient needs home oxygen: desaturating with ambulation Patient qualifies for home O2 -Start 2 L O2 titrate to maintain SPO2> 92% -Provide Inogen portable home O2 concentrator -High risk patient will require flu vaccination prior to discharge - High risk patient will require pneumonia vaccination prior to discharge - High risk patient upon follow-up with PCP will require RSV vaccination, in addition schedule establish care with pulmonologist for spirometry, DLCO. -Since patient having acute exacerbation currently unable to obtain COPD Assessment Test.  (CAT score) - Will assign patient  to group C for maintenance treatment of COPD given that she has had> 1 hospitalization in the last 2 months. -9/29 Umeclidinium Bromide  daily - 9/29 Albuterol PRN rescue inhaler - 9/29 DuoNeb PRN severe rescue inhaler when Albuterol ineffective - 9/29 and 1 to 2 weeks follow-up with PCP, will need to schedule establish care appointment with pulmonologist. - ATS guidelines Recommends annual low-dose CT scan screening for high-risk individuals (ages 33 to 58 years with ?30 pack-year history of smoking and current smoker or quit within past 15 years;  Tobacco abuse -9/28 TOC consult resources for stopping tobacco and marijuana use   Drug abuse - 9/28 urine rapid drug positive for THC -9/29 TOC consult resources for drug abuse.   Elevated troponin  -Troponin trend as below.   -EKG admission showed normal sinus rhythm with pronounced ST-T wave changes compared to previous EKG -9/26, echocardiogram with EF 70 to 75%, no wall motion abnormality, grade 1 diastolic dysfunction  Acute Diastolic CHF - Strict in and out - Daily weight - Upon discharge we will need to establish care with cardiologist acute diastolic CHF   Anxiety /depression/Chronic pain  -PTA on Zoloft 50 mg twice daily,  -Xanax 1 to 2 mg 3 times daily      Constipation place on bowel regimen    CVA Continue aspirin 81 mg daily.   L1 compression deformity  chronic and unchanged. Not in pain currently    Hypokalemia - Potassium goal> 4 - 9/29 K-Dur 40 mEq BID     Discharge Diagnoses:  Principal Problem:   COPD exacerbation ([place]) Active Problems:   L1 vertebral fracture ([facility])   Depression with anxiety   Tobacco abuse  Acute diastolic CHF (congestive heart failure) ([place])   COPD with acute exacerbation [facility])   Discharge Condition: stable   Diet recommendation: Heart Healthy  Filed Weights   12/02/21 0605 12/03/21 0513 12/04/21 0409  Weight: 63.3 kg 63.4 kg 64.1 kg    History of present illness:   Lindsay Joyce is a 61 y.o. WF PMHx tobacco abuse, Drug abuse, Anxiety, Depression, Chronic pain, remote history of CVA, and possible undiagnosed COPD   Presented to the ED on 9/25 with complaint of progressively worsening shortness of breath for several days despite using her nebulizers and a low O2 sat of 86% at home Last hospitalized 8/4 to 8/6 with acute hypoxic respiratory failure, presumed COPD exacerbation and was discharged on tapering course of steroids, Mucinex, nebulizers, incentive spirometry.   In the ED, patient was afebrile, heart rate in 90s blood pressure in normal range, breathing on 2 L oxygen by nasal cannula Labs with WC count 11.6, BMP unremarkable, respiratory panel negative Chest x-ray did not show any acute cardiopulmonary disease. EKG showed normal sinus rhythm but with pronounced ST-T wave changes compared to previous EKG Patient was treated with IV Solu-Medrol, DuoNeb  Hospital Course:  See above  Procedures: 9/28 CT chest W0 contrast -Pulmonary emphysema without acute finding. -Chronic L1 vertebral compression deformity   Antibiotics Anti-infectives (From admission, onward)    Start     Ordered Stop   12/04/21 1000  cefdinir (OMNICEF) capsule 300 mg        12/03/21 1833     12/01/21 1000  doxycycline (VIBRA-TABS) tablet 100 mg        12/01/21 0818 12/06/21 0959   11/30/21 1000  cefTRIAXone (ROCEPHIN) 1 g in sodium chloride 0.9 % 100 mL IVPB  Status:  Discontinued        11/30/21 0907 12/03/21 1833         Discharge Exam: Vitals:   12/03/21 1950 12/03/21 2013 12/04/21 0409 12/04/21 0847  BP: (!) 144/86  110/75   Pulse: 82  71   Resp:   17   Temp: 99 F (37.2 C)  98.1 F (36.7 C)   TempSrc: Oral  Oral   SpO2: 96% 95% 95% 95%  Weight:   64.1 kg   Height:        General: A/O x4, positive acute respiratory distress Lungs: Clear to auscultation bilaterally, positive mild expiratory wheezing bilateral, negative crackles Cardiovascular:  Regular rate and rhythm without murmur gallop or rub normal S1 and S2  Discharge Instructions   Allergies as of 12/04/2021       Reactions   Codeine Other (See Comments)   GI Upset    Dilaudid [hydromorphone] Other (See Comments)   GI Upset    Hydrocodone-acetaminophen Other (See Comments)   GI Upset   Mobic [meloxicam] Other (See Comments)   Upset stomach   Neurontin [gabapentin] Other (See Comments)   GI Upset    Oxymorphone Itching   Risperdal [risperidone] Other (See Comments)   "Made nose itch really bad"   Tramadol Other (See Comments)   Headache, Suicidal thoughts   Voltaren [diclofenac Sodium] Nausea And Vomiting   Morphine And Related Anxiety   "It tore up my nerves."        Medication List     STOP taking these medications    predniSONE 10 MG tablet Commonly known as: DELTASONE   UNABLE TO FIND       TAKE these medications    albuterol 108 (90 Base)  MCG/ACT inhaler Commonly known as: VENTOLIN HFA Inhale 2 puffs into the lungs every 4 (four) hours as needed for wheezing or shortness of breath.   ALPRAZolam 1 MG tablet Commonly known as: XANAX Take 1-2 tablets by mouth 3 (three) times daily.   aspirin EC 81 MG tablet Take 325 mg by mouth daily.   B-complex with vitamin C tablet Take 1 tablet by mouth daily.   cefdinir 300 MG capsule Commonly known as: OMNICEF Take 1 capsule (300 mg total) by mouth every 12 (twelve) hours.   dextromethorphan-guaiFENesin 30-600 MG 12hr tablet Commonly known as: MUCINEX DM Take 1 tablet by mouth 2 (two) times daily.   doxycycline 100 MG tablet Commonly known as: VIBRA-TABS Take 1 tablet (100 mg total) by mouth every 12 (twelve) hours.   ipratropium-albuterol 0.5-2.5 (3) MG/3ML Soln Commonly known as: DUONEB Take 3 mLs by nebulization every 6 (six) hours as needed (If albuterol rescue inhaler fails).   multivitamin capsule Take 1 capsule by mouth daily.   oxyCODONE-acetaminophen 7.5-325 MG  tablet Commonly known as: PERCOCET Take 1 tablet by mouth 2 (two) times daily as needed for moderate pain.   sertraline 50 MG tablet Commonly known as: ZOLOFT Take 1 tablet (50 mg total) by mouth 2 (two) times daily.   umeclidinium bromide 62.5 MCG/ACT Aepb Commonly known as: INCRUSE ELLIPTA Inhale 1 puff into the lungs daily. Start taking on: December 05, 2021   Vitamin D3 50 MCG (2000 UT) capsule Take 2,000 Units by mouth daily.               Durable Medical Equipment  (From admission, onward)           Start     Ordered   12/04/21 1157  For home use only DME oxygen  Once       Comments: SATURATION QUALIFICATIONS: (This note is used to comply with regulatory documentation for home oxygen) Patient Saturations on Room Air at Rest = 93% Patient Saturations on Room Air while Ambulating = 88% Patient Saturations on 2 Liters of oxygen while Ambulating = 95% Please briefly explain why patient needs home oxygen: desaturating with ambulation Patient qualifies for home O2 Start 2 L O2 titrate to maintain SPO2> 92% Provide Inogen portable home O2 concentrator  Question Answer Comment  Length of Need Lifetime   Mode or (Route) Nasal cannula   Liters per Minute 2   Frequency Continuous (stationary and portable oxygen unit needed)   Oxygen conserving device Yes   Oxygen delivery system Gas      12/04/21 1157           Allergies  Allergen Reactions   Codeine Other (See Comments)    GI Upset    Dilaudid [Hydromorphone] Other (See Comments)    GI Upset    Hydrocodone-Acetaminophen Other (See Comments)    GI Upset   Mobic [Meloxicam] Other (See Comments)    Upset stomach   Neurontin [Gabapentin] Other (See Comments)    GI Upset    Oxymorphone Itching   Risperdal [Risperidone] Other (See Comments)    "Made nose itch really bad"   Tramadol Other (See Comments)    Headache, Suicidal thoughts   Voltaren [Diclofenac Sodium] Nausea And Vomiting   Morphine And Related  Anxiety    "It tore up my nerves."      The results of significant diagnostics from this hospitalization (including imaging, microbiology, ancillary and laboratory) are listed below for reference.    Significant Diagnostic Studies:  CT CHEST WO CONTRAST  Result Date: 12/02/2021 CLINICAL DATA:  COPD exacerbation EXAM: CT CHEST WITHOUT CONTRAST TECHNIQUE: Multidetector CT imaging of the chest was performed following the standard protocol without IV contrast. RADIATION DOSE REDUCTION: This exam was performed according to the departmental dose-optimization program which includes automated exposure control, adjustment of the mA and/or kV according to patient size and/or use of iterative reconstruction technique. COMPARISON:  CT abdomen 01/07/2020 FINDINGS: Cardiovascular: Heart size normal. No pericardial effusion. Scattered atheromatous calcifications in the aortic arch. Mediastinum/Nodes: No mass or adenopathy. Lungs/Pleura: No pleural effusion. No pneumothorax. Emphysema (ICD10-J43.9). No significant nodule or infiltrate. Upper Abdomen: No acute findings.  Cholecystectomy clips. Musculoskeletal: Chronic L1 vertebral compression deformity. No acute findings. IMPRESSION: 1. Pulmonary emphysema without acute finding. 2. Chronic L1 vertebral compression deformity. 3. Aortic atherosclerosis (ICD10-I71.9). Electronically Signed   By: Lucrezia Europe M.D.   On: 12/02/2021 14:19   ECHOCARDIOGRAM COMPLETE  Result Date: 11/30/2021    ECHOCARDIOGRAM REPORT   Patient Name:   ELLAWYN SCHISLER Date of Exam: 11/30/2021 Medical Rec #:  [id number]      Height:       62.0 in Accession #:    [id number]     Weight:       141.0 lb Date of Birth:  Jul 28, 1960     BSA:          1.648 m Patient Age:    9 years       BP:           104/73 mmHg Patient Gender: F              HR:           103 bpm. Exam Location:  Inpatient Procedure: 2D Echo, Cardiac Doppler and Color Doppler Indications:    Abnormal ECG  History:        Patient has no  prior history of Echocardiogram examinations.  Sonographer:    Jefferey Pica Referring Phys: [id number] [place]  1. Left ventricular ejection fraction, by estimation, is 70 to 75%. The left ventricle has hyperdynamic function. The left ventricle has no regional wall motion abnormalities. Left ventricular diastolic parameters are consistent with Grade I diastolic dysfunction (impaired relaxation).  2. Right ventricular systolic function is hyperdynamic. The right ventricular size is normal. Tricuspid regurgitation signal is inadequate for assessing PA pressure. Small echodensity seen on RV chord, best seen in A4C view.  3. The mitral valve is normal in structure. No evidence of mitral valve regurgitation. No evidence of mitral stenosis.  4. The aortic valve was not well visualized. Aortic valve regurgitation is not visualized. No aortic stenosis is present.  5. The inferior vena cava is normal in size with greater than 50% respiratory variability, suggesting right atrial pressure of 3 mmHg. Comparison(s): No prior Echocardiogram. FINDINGS  Left Ventricle: Left ventricular ejection fraction, by estimation, is 70 to 75%. The left ventricle has hyperdynamic function. The left ventricle has no regional wall motion abnormalities. The left ventricular internal cavity size was normal in size. There is no left ventricular hypertrophy. Left ventricular diastolic parameters are consistent with Grade I diastolic dysfunction (impaired relaxation). Right Ventricle: The right ventricular size is normal. No increase in right ventricular wall thickness. Right ventricular systolic function is hyperdynamic. Tricuspid regurgitation signal is inadequate for assessing PA pressure. Left Atrium: Left atrial size was normal in size. Right Atrium: Right atrial size was normal in size. Pericardium: There is no evidence of pericardial effusion. Presence  of epicardial fat layer. Mitral Valve: The mitral valve is normal in  structure. No evidence of mitral valve regurgitation. No evidence of mitral valve stenosis. Tricuspid Valve: The tricuspid valve is normal in structure. Tricuspid valve regurgitation is not demonstrated. No evidence of tricuspid stenosis. Aortic Valve: The aortic valve was not well visualized. Aortic valve regurgitation is not visualized. No aortic stenosis is present. Aortic valve peak gradient measures 11.2 mmHg. Pulmonic Valve: The pulmonic valve was not well visualized. Pulmonic valve regurgitation is not visualized. Aorta: The aortic root and ascending aorta are structurally normal, with no evidence of dilitation. Venous: The inferior vena cava is normal in size with greater than 50% respiratory variability, suggesting right atrial pressure of 3 mmHg. IAS/Shunts: No atrial level shunt detected by color flow Doppler.  LEFT VENTRICLE PLAX 2D LVIDd:         4.20 cm   Diastology LVIDs:         2.50 cm   LV e' medial:    5.40 cm/s LV PW:         0.90 cm   LV E/e' medial:  12.2 LV IVS:        0.90 cm   LV e' lateral:   6.00 cm/s LVOT diam:     1.90 cm   LV E/e' lateral: 11.0 LV SV:         66 LV SV Index:   40 LVOT Area:     2.84 cm  RIGHT VENTRICLE             IVC RV Basal diam:  2.30 cm     IVC diam: 1.80 cm RV S prime:     14.00 cm/s LEFT ATRIUM             Index        RIGHT ATRIUM          Index LA diam:        3.40 cm 2.06 cm/m   RA Area:     7.72 cm LA Vol (A2C):   20.6 ml 12.50 ml/m  RA Volume:   12.60 ml 7.65 ml/m LA Vol (A4C):   17.7 ml 10.74 ml/m LA Biplane Vol: 19.7 ml 11.96 ml/m  AORTIC VALVE                 PULMONIC VALVE AV Area (Vmax): 2.43 cm     PV Vmax:       1.32 m/s AV Vmax:        167.00 cm/s  PV Peak grad:  7.0 mmHg AV Peak Grad:   11.2 mmHg LVOT Vmax:      143.00 cm/s LVOT Vmean:     79.200 cm/s LVOT VTI:       0.234 m  AORTA Ao Root diam: 2.90 cm Ao Asc diam:  3.10 cm MITRAL VALVE MV Area (PHT): 5.32 cm    SHUNTS MV Decel Time: 143 msec    Systemic VTI:  0.23 m MV E velocity: 65.90  cm/s  Systemic Diam: 1.90 cm MV A velocity: 98.25 cm/s MV E/A ratio:  0.67 Rudean Haskell MD Electronically signed by Rudean Haskell MD Signature Date/Time: 11/30/2021/4:44:51 PM    Final    DG Chest 2 View  Result Date: 11/29/2021 CLINICAL DATA:  Shortness of breath EXAM: CHEST - 2 VIEW COMPARISON:  Chest x-ray 10/08/2021 FINDINGS: The heart size and mediastinal contours are within normal limits. Both lungs are clear. L1 compression deformity is chronic and unchanged. No  acute fractures. IMPRESSION: No active cardiopulmonary disease. Electronically Signed   By: Ronney Asters M.D.   On: 11/29/2021 23:54    Microbiology: Recent Results (from the past 240 hour(s))  SARS Coronavirus 2 by RT PCR (hospital order, performed in [facility] hospital lab) *cepheid single result test* Anterior Nasal Swab     Status: None   Collection Time: 11/30/21  1:00 AM   Specimen: Anterior Nasal Swab  Result Value Ref Range Status   SARS Coronavirus 2 by RT PCR NEGATIVE NEGATIVE Final    Comment: (NOTE) SARS-CoV-2 target nucleic acids are NOT DETECTED.  The SARS-CoV-2 RNA is generally detectable in upper and lower respiratory specimens during the acute phase of infection. The lowest concentration of SARS-CoV-2 viral copies this assay can detect is 250 copies / mL. A negative result does not preclude SARS-CoV-2 infection and should not be used as the sole basis for treatment or other patient management decisions.  A negative result may occur with improper specimen collection / handling, submission of specimen other than nasopharyngeal swab, presence of viral mutation(s) within the areas targeted by this assay, and inadequate number of viral copies (<250 copies / mL). A negative result must be combined with clinical observations, patient history, and epidemiological information.  Fact Sheet for Patients:   https://www.patel.info/  Fact Sheet for Healthcare  Providers: https://hall.com/  This test is not yet approved or  cleared by the [place] FDA and has been authorized for detection and/or diagnosis of SARS-CoV-2 by FDA under an Emergency Use Authorization (EUA).  This EUA will remain in effect (meaning this test can be used) for the duration of the COVID-19 declaration under Section 564(b)(1) of the Act, 21 U.S.C. section 360bbb-3(b)(1), unless the authorization is terminated or revoked sooner.  Performed at [facility], [place] [address] [place], [place] [address]   Respiratory (~20 pathogens) panel by PCR     Status: None   Collection Time: 11/30/21 10:39 AM   Specimen: Nasopharyngeal Swab; Respiratory  Result Value Ref Range Status   Adenovirus NOT DETECTED NOT DETECTED Final   Coronavirus 229E NOT DETECTED NOT DETECTED Final    Comment: (NOTE) The Coronavirus on the Respiratory Panel, DOES NOT test for the novel  Coronavirus (2019 nCoV)    Coronavirus HKU1 NOT DETECTED NOT DETECTED Final   Coronavirus NL63 NOT DETECTED NOT DETECTED Final   Coronavirus OC43 NOT DETECTED NOT DETECTED Final   Metapneumovirus NOT DETECTED NOT DETECTED Final   Rhinovirus / Enterovirus NOT DETECTED NOT DETECTED Final   Influenza A NOT DETECTED NOT DETECTED Final   Influenza B NOT DETECTED NOT DETECTED Final   Parainfluenza Virus 1 NOT DETECTED NOT DETECTED Final   Parainfluenza Virus 2 NOT DETECTED NOT DETECTED Final   Parainfluenza Virus 3 NOT DETECTED NOT DETECTED Final   Parainfluenza Virus 4 NOT DETECTED NOT DETECTED Final   Respiratory Syncytial Virus NOT DETECTED NOT DETECTED Final   Bordetella pertussis NOT DETECTED NOT DETECTED Final   Bordetella Parapertussis NOT DETECTED NOT DETECTED Final   Chlamydophila pneumoniae NOT DETECTED NOT DETECTED Final   Mycoplasma pneumoniae NOT DETECTED NOT DETECTED Final    Comment: Performed at [facility], [place]. [address] [place], [place] [address]   Expectorated Sputum Assessment w Gram Stain, Rflx to Resp Cult     Status: None   Collection Time: 12/03/21 11:47 AM   Specimen: Expectorated Sputum  Result Value Ref Range Status   Specimen Description EXPECTORATED SPUTUM  Final  Special Requests NONE  Final   Sputum evaluation   Final    THIS SPECIMEN IS ACCEPTABLE FOR SPUTUM CULTURE Performed at [place] [address] [place], [place]  [address]    Report Status 12/03/2021 FINAL  Final  Culture, Respiratory w Gram Stain     Status: None (Preliminary result)   Collection Time: 12/03/21 11:47 AM  Result Value Ref Range Status   Specimen Description   Final    EXPECTORATED SPUTUM Performed at [facility] [address], [place], [place] [address]    Special Requests Reflexed from [phone]  Final   Gram Stain   Final    FEW SQUAMOUS EPITHELIAL CELLS PRESENT ABUNDANT WBC PRESENT, PREDOMINANTLY PMN FEW GRAM POSITIVE COCCI IN PAIRS    Culture   Final    CULTURE REINCUBATED FOR BETTER GROWTH Performed at [facility], [place] [address] [place], [place] [address]    Report Status PENDING  Incomplete     Labs: Basic Metabolic Panel: Recent Labs  Lab 11/30/21 0100 11/30/21 1039 12/01/21 0412 12/03/21 0440 12/04/21 0516  NA 141  --  141 141 141  K 3.8  --  4.1 3.4* 3.8  CL 109  --  110 107 108  CO2 26  --  25 29 27   GLUCOSE 108*  --  122* 91 82  BUN 11  --  19 21* 22*  CREATININE 0.96 0.88 1.03* 1.12* 1.03*  CALCIUM 8.7*  --  8.7* 8.4* 8.6*  MG  --   --   --  1.9 1.9  PHOS  --   --   --  5.3* 4.8*   Liver Function Tests: Recent Labs  Lab 12/01/21 0412 12/03/21 0440 12/04/21 0516  AST 24 17 16   ALT 15 18 18   ALKPHOS 19* 17* 16*  BILITOT 0.3 0.6 0.5  PROT 6.6 6.0* 6.0*  ALBUMIN 3.6 3.6 3.4*   No results for input(s): "LIPASE", "AMYLASE" in the last 168 hours. No results for input(s): "AMMONIA" in the last 168 hours. CBC: Recent Labs  Lab 11/30/21 0100  11/30/21 1039 12/01/21 0412 12/03/21 0440 12/04/21 0516  WBC 11.6* 4.9 8.3 10.3 10.6*  NEUTROABS 5.1 3.3  --  3.6 4.0  HGB 13.9 13.2 12.4 12.1 12.1  HCT 43.2 40.8 38.2 37.6 37.8  MCV 91.9 90.7 91.6 92.4 91.5  PLT 347 341 324 345 364   Cardiac Enzymes: No results for input(s): "CKTOTAL", "CKMB", "CKMBINDEX", "TROPONINI" in the last 168 hours. BNP: BNP (last 3 results) Recent Labs    11/30/21 1039  BNP 168.1*    ProBNP (last 3 results) No results for input(s): "PROBNP" in the last 8760 hours.  CBG: No results for input(s): "GLUCAP" in the last 168 hours.     Signed:  Dia Crawford, MD [facility]

## 2021-12-05 LAB — CULTURE, RESPIRATORY W GRAM STAIN: Culture: NORMAL

## 2022-03-07 DEATH — deceased
